# Patient Record
Sex: Female | Born: 1945
Health system: Southern US, Community
[De-identification: ages and names within clinical notes are randomized; demographics above are authoritative.]

## PROBLEM LIST (undated history)

## (undated) DIAGNOSIS — G20A1 Parkinson's disease without dyskinesia, without mention of fluctuations: Secondary | ICD-10-CM

---

## 2006-02-04 ENCOUNTER — Ambulatory Visit: Payer: Self-pay | Admitting: Internal Medicine

## 2008-05-03 ENCOUNTER — Ambulatory Visit: Payer: Self-pay | Admitting: Internal Medicine

## 2008-07-11 ENCOUNTER — Ambulatory Visit: Payer: Self-pay | Admitting: Internal Medicine

## 2008-10-23 ENCOUNTER — Ambulatory Visit: Payer: Self-pay | Admitting: Unknown Physician Specialty

## 2009-09-16 ENCOUNTER — Ambulatory Visit: Payer: Self-pay | Admitting: Internal Medicine

## 2009-12-19 ENCOUNTER — Ambulatory Visit: Payer: Self-pay | Admitting: Podiatrist

## 2010-04-23 ENCOUNTER — Ambulatory Visit: Payer: Self-pay | Admitting: Internal Medicine

## 2011-05-01 ENCOUNTER — Ambulatory Visit: Payer: Self-pay | Admitting: Internal Medicine

## 2011-07-20 ENCOUNTER — Ambulatory Visit: Payer: Self-pay | Admitting: Neurology

## 2011-11-26 ENCOUNTER — Ambulatory Visit: Payer: Self-pay | Admitting: Internal Medicine

## 2012-05-24 ENCOUNTER — Ambulatory Visit: Payer: Self-pay | Admitting: Internal Medicine

## 2012-05-26 ENCOUNTER — Ambulatory Visit: Payer: Self-pay | Admitting: Internal Medicine

## 2012-06-07 ENCOUNTER — Ambulatory Visit: Payer: Self-pay | Admitting: Neurology

## 2013-05-10 ENCOUNTER — Ambulatory Visit: Payer: Self-pay | Admitting: Internal Medicine

## 2013-06-19 ENCOUNTER — Ambulatory Visit: Payer: Self-pay | Admitting: Internal Medicine

## 2013-10-17 ENCOUNTER — Ambulatory Visit: Payer: Self-pay | Admitting: Ophthalmology

## 2013-11-08 ENCOUNTER — Ambulatory Visit: Payer: Self-pay | Admitting: Ophthalmology

## 2014-08-09 ENCOUNTER — Ambulatory Visit: Payer: Self-pay | Admitting: Internal Medicine

## 2014-12-11 ENCOUNTER — Ambulatory Visit: Payer: Self-pay | Admitting: Internal Medicine

## 2015-06-24 ENCOUNTER — Ambulatory Visit: Payer: PPO | Admitting: Physical Therapy

## 2015-06-24 ENCOUNTER — Ambulatory Visit: Payer: PPO | Attending: Internal Medicine | Admitting: Speech Pathology

## 2015-06-24 ENCOUNTER — Encounter: Payer: Self-pay | Admitting: Physical Therapy

## 2015-06-24 DIAGNOSIS — R29818 Other symptoms and signs involving the nervous system: Secondary | ICD-10-CM

## 2015-06-24 DIAGNOSIS — R490 Dysphonia: Secondary | ICD-10-CM | POA: Insufficient documentation

## 2015-06-24 DIAGNOSIS — R262 Difficulty in walking, not elsewhere classified: Secondary | ICD-10-CM | POA: Insufficient documentation

## 2015-06-24 NOTE — Therapy (Signed)
[place] [facility] MAIN [facility] SERVICES [address] [place], [place], [address] Phone: [phone]   Fax:  [phone]  Physical Therapy Evaluation  Patient Details  Name: Monica Watts MRN: [id number] Date of Birth: December 04, 1945 Referring Provider:  Danella Penton, MD  Encounter Date: 06/24/2015      PT End of Session - 06/24/15 1513    Visit Number 1   Number of Visits 17   Authorization Type 1   Authorization Time Period 10   PT Start Time 0150   PT Stop Time 0250   PT Time Calculation (min) 60 min   Equipment Utilized During Treatment Gait belt   Activity Tolerance Patient tolerated treatment well   Behavior During Therapy [facility] for tasks assessed/performed      No past medical history on file.  No past surgical history on file.  There were no vitals filed for this visit.  Visit Diagnosis:  Difficulty balancing  Difficulty walking      Subjective Assessment - 06/24/15 1457    Subjective Patient is having difficutly with walking and balance.    Currently in Pain? Yes   Pain Score 0-No pain            [facility] PT Assessment - 06/24/15 0001    Assessment   Medical Diagnosis parkinsons disease   Onset Date/Surgical Date 12/10/09   Hand Dominance Right   Next MD Visit 07/01/15   Prior Therapy no   Precautions   Precautions None   Balance Screen   Has the patient fallen in the past 6 months No   Has the patient had a decrease in activity level because of a fear of falling?  Yes   Is the patient reluctant to leave their home because of a fear of falling?  No   Home [employer] information [employer] residence   Living Arrangements Spouse/significant other   Available Help at Discharge Family   Type of Home House   Home Access Level entry        Eval; Outcome measures: TUG:9.13 sec 10 MW:1.22 m/sec 5 x sit stand:16.31 sec 6 MW; 1240 feet Peg test : right   42.98 sec   Left 40.99 sec Patient has difficulty with  buttoning, putting on make up, small backs on earings, picking up object form the floor, slide down a booth, feed my self, snapping a bean, putting on a sweater, socks and shoes, pants, writing, safety belt, transfers in and out of the car Gait training : Patient ambulates without AD with decreased arm swing bilaterally Coordination: impaired BUE hands Sensation: impaired BLE feet numbness Strength: 4/5 BLE, 5/5 BUE and grip is [facility] Balance: poor standing balance, unable to tandem stand or single leg stand                          PT Long Term Goals - 06/24/15 1510    PT LONG TERM GOAL #1   Title Patient will increase 10 meter walk test to >1.25m/s as to improve gait speed for better community ambulation and to reduce fall    PT LONG TERM GOAL #2   Title Patient will increase six minute walk test distance to >1000 for progression to community ambulator and improve gait ability   PT LONG TERM GOAL #3   Title Patient will tolerate 5 seconds of single leg stance without loss of balance to improve ability to get in and out  of shower safely   PT LONG TERM GOAL #4   Title Patient (< 61 years old) will complete five times sit to stand test in < 10 seconds indicating an increased LE strength and improved balance.               Plan - 06-27-2015 1514    Clinical Impression Statement Patient is 69 yr old female with Dx of parkinsons and impaired gait decreased dynamic standing balance, decreased hand coordination and increased falls risk.    Pt will benefit from skilled therapeutic intervention in order to improve on the following deficits Abnormal gait;Decreased endurance;Impaired sensation;Pain;Decreased activity tolerance;Decreased strength;Decreased balance;Decreased mobility;Difficulty walking;Decreased coordination;Decreased safety awareness   Rehab Potential Good   PT Frequency 4x / week   PT Duration 4 weeks   PT Treatment/Interventions Balance training;Therapeutic  exercise;Therapeutic activities;Functional mobility training;Stair training;Gait training;Neuromuscular re-education   Consulted and Agree with Plan of Care Patient          G-Codes - 06/27/15 1642    Functional Assessment Tool Used TUG, 5 x sit to stand, 10 MW, 6 MW   Functional Limitation Mobility: Walking and moving around   Mobility: Walking and Moving Around Current Status [phone]) At least 20 percent but less than 40 percent impaired, limited or restricted   Mobility: Walking and Moving Around Goal Status [phone]) At least 1 percent but less than 20 percent impaired, limited or restricted       Problem List There are no active problems to display for this patient.   Ezekiel Ina Jun 27, 2015, 4:59 PM   [facility] MAIN [facility] SERVICES [address] [place], [place], [address] Phone: [phone]   Fax:  [phone]

## 2015-06-25 ENCOUNTER — Encounter: Payer: Self-pay | Admitting: Speech Pathology

## 2015-06-25 NOTE — Therapy (Signed)
[place] [facility] MAIN [facility] SERVICES [address] [place], [place], [address] Phone: [phone]   Fax:  [phone]  Speech Language Pathology Evaluation  Patient Details  Name: Monica Watts MRN: [id number] Date of Birth: Apr 25, 1970 Referring Provider:  Danella Penton, MD  Encounter Date: 06/24/2015      End of Session - 06/25/15 1237    Visit Number 1   Number of Visits 17   Date for SLP Re-Evaluation 08/02/15   SLP Start Time 1400   SLP Stop Time  1450   SLP Time Calculation (min) 50 min   Activity Tolerance Patient tolerated treatment well      History reviewed. No pertinent past medical history.  History reviewed. No pertinent past surgical history.  There were no vitals filed for this visit.  Visit Diagnosis: Dysphonia - Plan: SLP plan of care cert/re-cert      Subjective Assessment - 06/25/15 1233    Subjective The patient reports that she has observed changes due to Parkinson's; changes include hypophonia, hoarse vocal quality, monotone speech, and decreased facial expressions.   Currently in Pain? No/denies            SLP Evaluation [facility] - 06/25/15 1233    SLP Visit Information   SLP Received On 06/24/15   Onset Date 05/09/2015   Medical Diagnosis Parkinson's disease   Subjective   Patient/Family Stated Goal Robust, audible voice   Prior Functional Status   Cognitive/Linguistic Baseline Within functional limits  Worsening vocal quality and loudness due to PD   Standardized Assessments   Standardized Assessments  Other Assessment  LSVT-LOUD Voice Evaluation        LSVT-LOUD Voice Evaluation  Voice history: The patient reports that she has observed changes due to Parkinsons; changes include hypophonia, hoarse vocal quality, monotone speech, and decreased facial expressions.  Maximum phonation time for sustained ah: 17 seconds  Mean intensity during sustained ah: 70 dB (Initially at 65 dB average, but she  spontaneously increased loudness secondary previous knowledge of LSVT-LOUD)  Mean intensity sustained during conversational speech: 63 dB  Average fundamental frequency during sustained ah: 184 Hz (2.2 STD below mean for age and gender)  Highest dynamic pitch when altering pitch from a low note to a high note: 402 Hz  Highest pitch during conversational speech: 385 Hz  Lowest dynamic pitch when altering from a high note to a low note: 178 Hz  Lowest pitch during conversational speech: 120 Hz  Visi-Pitch: Multi-Dimensional Voice Program (MDVP)  MDVP extracts objective quantitative values (Relative Average Perturbation, Shimmer, Voice Turbulence Index, and Noise to Harmonic Ratio) on sustained phonation, which are displayed graphically and numerically in comparison to a built-in normative database.  The patient exhibited values outside the norm for Relative Average Perturbation, Shimmer, Voice Turbulence Index, and Noise to Harmonic Ratio.  Average fundamental frequency was 2.2 STD below the average for age and gender. The patient improved all parameters when cued to alter voicing (loud like me).         SLP Education - 06/25/15 1236    Education provided Yes   Education Details Describe LSVT-LOUD protocol and expected outcome   Person(s) Educated Patient   Methods Explanation   Comprehension Verbalized understanding            SLP Long Term Goals - 06/25/15 1241    SLP LONG TERM GOAL #1   Title The patient will complete Daily Tasks (Maximum duration "ah", High/Lows, and Functional Phrases) at  average loudness of 80 dB and with loud, good quality voice.    Time 4   Period Weeks   Status New   SLP LONG TERM GOAL #2   Title The patient will complete Hierarchal Speech Loudness reading drills (words/phrases, sentences, and paragraph) at average 75 dB and with loud, good quality voice.     Time 4   Period Weeks   Status New   SLP LONG TERM GOAL #3   Title The patient will  complete homework daily.   Time 4   Period Weeks   Status New   SLP LONG TERM GOAL #4   Title The patient will participate in conversation, maintaining average loudness of 75 dB and loud, good quality voice.   Time 4   Period Weeks   Status New          Plan - 07/04/15 1238    Clinical Impression Statement This 69 year old woman with diagnosed Parkinson' disease is presenting with moderate-severe voice disorder characterized by hoarse vocal quality, hypophonia, monotone voice, and decreased facial expression.  Based on stimulability testing, the patient is judged to be a good candidate for the LSVT LOUD program.  It is recommended that the patient receive the LSVT LOUD program which is comprised of 16 intensive sessions (4 times per week for 4 weeks, one hour sessions).  Prognosis for improvement is good based on his motivation, stimulability, and strong family support.  LSVT LOUD has been documented in the literature as efficacious for individuals with Parkinson's disease.     Speech Therapy Frequency 4x / week   Duration 4 weeks   Treatment/Interventions Other (comment)  LSVT-LOUD Protocol   Potential to Achieve Goals Good   Potential Considerations Ability to learn/carryover information;Co-morbidities;Cooperation/participation level;Previous level of function;Severity of impairments;Family/community support;Other (comment)  Stimulability   SLP Home Exercise Plan LSVT-LOUD daily homework   Consulted and Agree with Plan of Care Patient          G-Codes - 04-Jul-2015 1242    Functional Assessment Tool Used LSVT-LOUD Evaluation Protocol   Functional Limitations Voice   Voice Current Status (G9171) At least 60 percent but less than 80 percent impaired, limited or restricted   Voice Goal Status (G9172) At least 1 percent but less than 20 percent impaired, limited or restricted      Problem List There are no active problems to display for this patient.  Dollene Primrose, MS/CCC-  SLP  Leandrew Koyanagi 07-04-2015, 12:47 PM  [place] [facility] MAIN [facility] SERVICES [address] [place], [place], [address] Phone: [phone]   Fax:  [phone]

## 2015-07-01 ENCOUNTER — Encounter: Payer: Self-pay | Admitting: Speech Pathology

## 2015-07-01 ENCOUNTER — Ambulatory Visit: Payer: PPO | Admitting: Speech Pathology

## 2015-07-01 ENCOUNTER — Encounter: Payer: Self-pay | Admitting: Physical Therapy

## 2015-07-01 ENCOUNTER — Ambulatory Visit: Payer: PPO | Admitting: Physical Therapy

## 2015-07-01 DIAGNOSIS — R262 Difficulty in walking, not elsewhere classified: Secondary | ICD-10-CM

## 2015-07-01 DIAGNOSIS — R490 Dysphonia: Secondary | ICD-10-CM | POA: Diagnosis not present

## 2015-07-01 DIAGNOSIS — R29818 Other symptoms and signs involving the nervous system: Secondary | ICD-10-CM

## 2015-07-01 NOTE — Therapy (Signed)
[place] [facility] MAIN [facility] SERVICES [address] [place], [place], [address] Phone: [phone]   Fax:  [phone]  Physical Therapy Treatment  Patient Details  Name: Monica Watts MRN: [id number] Date of Birth: January 12, 1946 Referring Provider:  Danella Penton, MD  Encounter Date: 07/01/2015      PT End of Session - 07/01/15 1340    Visit Number 2   Number of Visits 17   Authorization Type 2   Authorization Time Period 10   PT Start Time 0100   PT Stop Time 0155   PT Time Calculation (min) 55 min   Equipment Utilized During Treatment Gait belt   Activity Tolerance Patient tolerated treatment well   Behavior During Therapy [facility] for tasks assessed/performed      History reviewed. No pertinent past medical history.  History reviewed. No pertinent past surgical history.  There were no vitals filed for this visit.  Visit Diagnosis:  Difficulty balancing  Difficulty walking      Subjective Assessment - 07/01/15 1338    Subjective Patient is having difficulty with balance and walking.    Currently in Pain? No/denies       Floor to ceiling x 10 reps, side to side 10 reps, step and reach forward x 10 reps, step and reach backwards x 10, step and reach sideways x 10 , Rock and reach forward/backward x 10 , Rock and reach sideways x 10, functional tasks; 1 sit to stand 2. Coins bank, cards shuffle for improving motor control for buttons and manipulating small items, and opening watter bottles3. Activities to improve seat belt in the car. 4. Raising up legs to get in and out of the car. 5. Get up on the bed. Continues to have balance deficits typical with diagnosis. Patient performs beginning level exercises without pain behaviors and needs verbal cuing for postural alignment and head positioning                           PT Education - 07/01/15 1339    Education provided Yes   Person(s) Educated Patient   Methods  Explanation   Comprehension Verbalized understanding             PT Long Term Goals - 06/24/15 1510    PT LONG TERM GOAL #1   Title Patient will increase 10 meter walk test to >1.34m/s as to improve gait speed for better community ambulation and to reduce fall    PT LONG TERM GOAL #2   Title Patient will increase six minute walk test distance to >1000 for progression to community ambulator and improve gait ability   PT LONG TERM GOAL #3   Title Patient will tolerate 5 seconds of single leg stance without loss of balance to improve ability to get in and out of shower safely   PT LONG TERM GOAL #4   Title Patient (< 4 years old) will complete five times sit to stand test in < 10 seconds indicating an increased LE strength and improved balance.               Plan - 07/01/15 1344    Clinical Impression Statement Patient was instructed in LSVT BIG exercise program and had difficulty with sequencing and stepping for all exericses.    Pt will benefit from skilled therapeutic intervention in order to improve on the following deficits Abnormal gait;Decreased endurance;Impaired sensation;Pain;Decreased activity tolerance;Decreased strength;Decreased balance;Decreased mobility;Difficulty walking;Decreased coordination;Decreased  safety awareness   Rehab Potential Good   PT Frequency 4x / week   PT Duration 4 weeks   PT Treatment/Interventions Balance training;Therapeutic exercise;Therapeutic activities;Functional mobility training;Stair training;Gait training;Neuromuscular re-education   Consulted and Agree with Plan of Care Patient        Problem List There are no active problems to display for this patient.   Ezekiel Ina 07/01/2015, 1:46 PM  [place] [facility] MAIN [facility] SERVICES [address] [place], [place], [address] Phone: [phone]   Fax:  [phone]

## 2015-07-01 NOTE — Therapy (Signed)
[place] [facility] MAIN [facility] SERVICES [address] [place], [place], [address] Phone: [phone]   Fax:  [phone]  Speech Language Pathology Treatment  Patient Details  Name: Monica Watts MRN: [id number] Date of Birth: Jun 13, 1946 Referring Provider:  Danella Penton, MD  Encounter Date: 07/01/2015      End of Session - 07/01/15 1454    Visit Number 2   Number of Visits 17   Date for SLP Re-Evaluation 08/02/15   SLP Start Time 1405   SLP Stop Time  1455   SLP Time Calculation (min) 50 min   Activity Tolerance Patient tolerated treatment well      History reviewed. No pertinent past medical history.  History reviewed. No pertinent past surgical history.  There were no vitals filed for this visit.  Visit Diagnosis: Dysphonia      Subjective Assessment - 07/01/15 1453    Subjective "That seems too loud to me".   Currently in Pain? No/denies               ADULT SLP TREATMENT - 07/01/15 0001    General Information   Behavior/Cognition Alert;Cooperative;Pleasant mood   Treatment Provided   Treatment provided Cognitive-Linquistic   Pain Assessment   Pain Assessment No/denies pain   Cognitive-Linquistic Treatment   Treatment focused on Voice  LSVT-LOUD   Skilled Treatment Daily Task #1: Average 12 seconds, 85 dB. Requires min-mod cues for loudness and quality.  Daily Task 2: Highs: 15 high pitched "ah" given mod-max cues. Lows: 15 low pitched "ah" given mod-max cues. Daily task #3: Average 70 dB.  Hierarchal speech loudness drill: Read words, 68 dB.  Read phrases, 70 dB. Homework: assignments given.  Off the cuff remarks: average 63 dB.   Assessment / Recommendations / Plan   Plan Continue with current plan of care   Progression Toward Goals   Progression toward goals Progressing toward goals          SLP Education - 07/01/15 1453    Education provided Yes   Education Details LSVT-LOUD protocol   Person(s) Educated  Patient   Methods Explanation;Demonstration;Verbal cues;Handout   Comprehension Verbalized understanding;Returned demonstration;Need further instruction            SLP Long Term Goals - 06/25/15 1241    SLP LONG TERM GOAL #1   Title The patient will complete Daily Tasks (Maximum duration "ah", High/Lows, and Functional Phrases) at average loudness of 80 dB and with loud, good quality voice.    Time 4   Period Weeks   Status New   SLP LONG TERM GOAL #2   Title The patient will complete Hierarchal Speech Loudness reading drills (words/phrases, sentences, and paragraph) at average 75 dB and with loud, good quality voice.     Time 4   Period Weeks   Status New   SLP LONG TERM GOAL #3   Title The patient will complete homework daily.   Time 4   Period Weeks   Status New   SLP LONG TERM GOAL #4   Title The patient will participate in conversation, maintaining average loudness of 75 dB and loud, good quality voice.   Time 4   Period Weeks   Status New          Plan - 07/01/15 1455    Clinical Impression Statement The patient is completing daily tasks and hierarchal speech drill tasks with loud, good quality voice given mod-max SLP cues for loudness and quality.  Speech Therapy Frequency 4x / week   Duration 4 weeks   Treatment/Interventions Other (comment)  LSVT-LOUD   Potential to Achieve Goals Good   Potential Considerations Ability to learn/carryover information;Co-morbidities;Cooperation/participation level;Previous level of function;Severity of impairments;Family/community support;Other (comment)   SLP Home Exercise Plan LSVT-LOUD daily homework   Consulted and Agree with Plan of Care Patient        Problem List There are no active problems to display for this patient.  Dollene Primrose, MS/CCC- SLP  Leandrew Koyanagi 07/01/2015, 2:56 PM  [place] [facility] MAIN [facility] SERVICES [address] [place], [place], [address] Phone:  [phone]   Fax:  [phone]

## 2015-07-02 ENCOUNTER — Encounter: Payer: Self-pay | Admitting: Physical Therapy

## 2015-07-02 ENCOUNTER — Ambulatory Visit: Payer: PPO | Admitting: Speech Pathology

## 2015-07-02 ENCOUNTER — Encounter: Payer: Self-pay | Admitting: Speech Pathology

## 2015-07-02 ENCOUNTER — Ambulatory Visit: Payer: PPO | Admitting: Physical Therapy

## 2015-07-02 DIAGNOSIS — R262 Difficulty in walking, not elsewhere classified: Secondary | ICD-10-CM

## 2015-07-02 DIAGNOSIS — R29818 Other symptoms and signs involving the nervous system: Secondary | ICD-10-CM

## 2015-07-02 DIAGNOSIS — R490 Dysphonia: Secondary | ICD-10-CM

## 2015-07-02 NOTE — Therapy (Signed)
[place] [facility] MAIN [facility] SERVICES [address] [place], [place], [address] Phone: [phone]   Fax:  [phone]  Speech Language Pathology Treatment  Patient Details  Name: Monica Watts MRN: [id number] Date of Birth: [birth year] Referring Provider:  Danella Penton, MD  Encounter Date: 07/02/2015      End of Session - 07/02/15 1640    Visit Number 3   Number of Visits 17   Date for SLP Re-Evaluation 08/02/15   SLP Start Time 1400   SLP Stop Time  1456   SLP Time Calculation (min) 56 min   Activity Tolerance Patient tolerated treatment well      History reviewed. No pertinent past medical history.  History reviewed. No pertinent past surgical history.  There were no vitals filed for this visit.  Visit Diagnosis: Dysphonia      Subjective Assessment - 07/02/15 1639    Subjective "I was loud with Dr. Hyacinth Meeker yesterday and he noticed!"   Currently in Pain? No/denies               ADULT SLP TREATMENT - 07/02/15 0001    General Information   Behavior/Cognition Alert;Cooperative;Pleasant mood   Treatment Provided   Treatment provided Cognitive-Linquistic   Pain Assessment   Pain Assessment No/denies pain   Cognitive-Linquistic Treatment   Treatment focused on Voice  LSVT-LOUD   Skilled Treatment Daily Task #1: Average 14 seconds, 85 dB. Requires min cues for loudness and quality.  Daily Task 2: Highs: 15 high pitched "ah" given mod cues. Lows: 15 low pitched "ah" given mod cues. Daily task #3: Average 73 dB.  Hierarchal speech loudness drill: Read words, 70 dB.  Read phrases/sentences, 73 dB. Homework: assignments completed.  Off the cuff remarks: average 66 dB, up to 70 dB given min SLP reminder.   Assessment / Recommendations / Plan   Plan Continue with current plan of care   Progression Toward Goals   Progression toward goals Progressing toward goals          SLP Education - 07/02/15 1640    Education provided Yes    Education Details LSVT-LOUD   Person(s) Educated Patient   Methods Explanation;Demonstration;Verbal cues;Handout   Comprehension Verbalized understanding;Returned demonstration;Need further instruction            SLP Long Term Goals - 06/25/15 1241    SLP LONG TERM GOAL #1   Title The patient will complete Daily Tasks (Maximum duration "ah", High/Lows, and Functional Phrases) at average loudness of 80 dB and with loud, good quality voice.    Time 4   Period Weeks   Status New   SLP LONG TERM GOAL #2   Title The patient will complete Hierarchal Speech Loudness reading drills (words/phrases, sentences, and paragraph) at average 75 dB and with loud, good quality voice.     Time 4   Period Weeks   Status New   SLP LONG TERM GOAL #3   Title The patient will complete homework daily.   Time 4   Period Weeks   Status New   SLP LONG TERM GOAL #4   Title The patient will participate in conversation, maintaining average loudness of 75 dB and loud, good quality voice.   Time 4   Period Weeks   Status New          Plan - 07/02/15 1641    Clinical Impression Statement The patient is completing daily tasks and hierarchal speech drill tasks with loud,  good quality voice given fewer SLP cues for loudness and quality.  She is demonstrating inconsistent generalization into conversational speech.   Speech Therapy Frequency 4x / week   Duration 4 weeks   Treatment/Interventions Other (comment)  LSVT-LOUD   Potential to Achieve Goals Good   Potential Considerations Ability to learn/carryover information;Co-morbidities;Cooperation/participation level;Previous level of function;Severity of impairments;Family/community support;Other (comment)   SLP Home Exercise Plan LSVT-LOUD daily homework   Consulted and Agree with Plan of Care Patient        Problem List There are no active problems to display for this patient.  Dollene Primrose, MS/CCC- SLP  Leandrew Koyanagi 07/02/2015, 4:42  PM  [place] [facility] MAIN [facility] SERVICES [address] [place], [place], [address] Phone: [phone]   Fax:  [phone]

## 2015-07-02 NOTE — Therapy (Signed)
[place] [facility] MAIN [facility] SERVICES [address] [place], [place], [address] Phone: [phone]   Fax:  [phone]  Physical Therapy Treatment  Patient Details  Name: Monica Watts MRN: [id number] Date of Birth: 13-Nov-1945 Referring Provider:  Danella Penton, MD  Encounter Date: 07/02/2015      PT End of Session - 07/02/15 1508    Visit Number 3   Number of Visits 17   Authorization Type 2   Authorization Time Period 10   PT Start Time 0300   PT Stop Time 0355   PT Time Calculation (min) 55 min   Equipment Utilized During Treatment Gait belt   Activity Tolerance Patient tolerated treatment well   Behavior During Therapy [facility] for tasks assessed/performed      History reviewed. No pertinent past medical history.  History reviewed. No pertinent past surgical history.  There were no vitals filed for this visit.  Visit Diagnosis:  Difficulty walking  Difficulty balancing      Subjective Assessment - 07/02/15 1505    Subjective Patient did her exercises but feels like she has questions and is not sure she did them correctly.    Currently in Pain? No/denies        Floor to ceiling x 10 reps, side to side 10 reps, step and reach forward x 10 reps, step and reach backwards x 10, step and reach sideways x 10 , Rock and reach forward/backward x 10 , Rock and reach sideways x 10, functional tasks; 1 sit to stand 2. Coins bank, cards shuffle for improving motor control for buttons and manipulating small items, and opening watter bottles3. Activities to improve seat belt in the car. 4. Raising up legs to get in and out of the car. 5. Get up on the bed. Continues to have balance deficits typical with diagnosis. Patient performs beginning level exercises without pain behaviors and needs verbal cuing for postural alignment and head positioning                          PT Education - 07/02/15 1507    Education provided Yes   Education Details LSVT BIG   Person(s) Educated Patient   Methods Explanation   Comprehension Verbalized understanding             PT Long Term Goals - 06/24/15 1510    PT LONG TERM GOAL #1   Title Patient will increase 10 meter walk test to >1.32m/s as to improve gait speed for better community ambulation and to reduce fall    PT LONG TERM GOAL #2   Title Patient will increase six minute walk test distance to >1000 for progression to community ambulator and improve gait ability   PT LONG TERM GOAL #3   Title Patient will tolerate 5 seconds of single leg stance without loss of balance to improve ability to get in and out of shower safely   PT LONG TERM GOAL #4   Title Patient (< 54 years old) will complete five times sit to stand test in < 10 seconds indicating an increased LE strength and improved balance.               Plan - 07/02/15 1508    Clinical Impression Statement  pt especially has difficulty with opening hands up towards the ceiling with multiple exercises and with extending his leg during seated side to side multi-directional reaching activity   Pt will  benefit from skilled therapeutic intervention in order to improve on the following deficits Abnormal gait;Decreased endurance;Impaired sensation;Pain;Decreased activity tolerance;Decreased strength;Decreased balance;Decreased mobility;Difficulty walking;Decreased coordination;Decreased safety awareness   PT Frequency 4x / week   PT Duration 4 weeks   PT Treatment/Interventions Balance training;Therapeutic exercise;Therapeutic activities;Functional mobility training;Stair training;Gait training;Neuromuscular re-education        Problem List There are no active problems to display for this patient.   Ezekiel Ina 07/02/2015, 3:10 PM  [place] [facility] MAIN [facility] SERVICES [address] [place], [place], [address] Phone: [phone]   Fax:  [phone]

## 2015-07-03 ENCOUNTER — Encounter: Payer: Self-pay | Admitting: Physical Therapy

## 2015-07-03 ENCOUNTER — Ambulatory Visit: Payer: PPO | Admitting: Physical Therapy

## 2015-07-03 ENCOUNTER — Ambulatory Visit: Payer: PPO | Admitting: Speech Pathology

## 2015-07-03 ENCOUNTER — Encounter: Payer: Self-pay | Admitting: Speech Pathology

## 2015-07-03 DIAGNOSIS — R262 Difficulty in walking, not elsewhere classified: Secondary | ICD-10-CM

## 2015-07-03 DIAGNOSIS — R490 Dysphonia: Secondary | ICD-10-CM | POA: Diagnosis not present

## 2015-07-03 DIAGNOSIS — R29818 Other symptoms and signs involving the nervous system: Secondary | ICD-10-CM

## 2015-07-03 NOTE — Therapy (Signed)
[place] [facility] MAIN [facility] SERVICES [address] [place], [place], [address] Phone: [phone]   Fax:  [phone]  Speech Language Pathology Treatment  Patient Details  Name: Monica Watts MRN: [id number] Date of Birth: Aug 01, 1946 Referring Provider:  Danella Penton, MD  Encounter Date: 07/03/2015      End of Session - 07/03/15 1459    Visit Number 4   Number of Visits 17   Date for SLP Re-Evaluation 08/02/15   SLP Start Time 1400   SLP Stop Time  1456   SLP Time Calculation (min) 56 min   Activity Tolerance Patient tolerated treatment well      History reviewed. No pertinent past medical history.  History reviewed. No pertinent past surgical history.  There were no vitals filed for this visit.  Visit Diagnosis: Dysphonia      Subjective Assessment - 07/03/15 1458    Subjective Patient reports that she is tired today.   Currently in Pain? No/denies               ADULT SLP TREATMENT - 07/03/15 0001    General Information   Behavior/Cognition Alert;Cooperative;Pleasant mood   Treatment Provided   Treatment provided Cognitive-Linquistic   Pain Assessment   Pain Assessment No/denies pain   Cognitive-Linquistic Treatment   Treatment focused on Voice  LSVT-LOUD   Skilled Treatment Daily Task #1: Average 14 seconds, 85 dB. Requires min-mod cues for loudness and quality.  Daily Task 2: Highs: 15 high pitched "ah" given mod cues. Lows: 15 low pitched "ah" given mod cues. Daily task #3: Average 73 dB.  Hierarchal speech loudness drill: Read phrases/sentences, 73 dB. Generate short phrases to answer questions, 70 dB.  Homework: assignments completed.  Off the cuff remarks: average 66 dB, up to 70 dB given min-mod SLP reminder.   Assessment / Recommendations / Plan   Plan Continue with current plan of care   Progression Toward Goals   Progression toward goals Progressing toward goals          SLP Education - 07/03/15 1459     Education provided Yes   Education Details LSVT-LOUD   Person(s) Educated Patient   Methods Explanation;Demonstration;Verbal cues;Handout   Comprehension Verbalized understanding;Returned demonstration;Need further instruction            SLP Long Term Goals - 06/25/15 1241    SLP LONG TERM GOAL #1   Title The patient will complete Daily Tasks (Maximum duration "ah", High/Lows, and Functional Phrases) at average loudness of 80 dB and with loud, good quality voice.    Time 4   Period Weeks   Status New   SLP LONG TERM GOAL #2   Title The patient will complete Hierarchal Speech Loudness reading drills (words/phrases, sentences, and paragraph) at average 75 dB and with loud, good quality voice.     Time 4   Period Weeks   Status New   SLP LONG TERM GOAL #3   Title The patient will complete homework daily.   Time 4   Period Weeks   Status New   SLP LONG TERM GOAL #4   Title The patient will participate in conversation, maintaining average loudness of 75 dB and loud, good quality voice.   Time 4   Period Weeks   Status New          Plan - 07/03/15 1459    Clinical Impression Statement The patient is completing daily tasks and hierarchal speech drill tasks with  loud, good quality voice given fewer SLP cues for loudness and quality.  She is demonstrating inconsistent generalization into conversational speech.   Speech Therapy Frequency 4x / week   Duration 4 weeks   Treatment/Interventions Other (comment)  LSVT-LOUD   Potential to Achieve Goals Good   Potential Considerations Ability to learn/carryover information;Co-morbidities;Cooperation/participation level;Previous level of function;Severity of impairments;Family/community support;Other (comment)   SLP Home Exercise Plan LSVT-LOUD daily homework   Consulted and Agree with Plan of Care Patient        Problem List There are no active problems to display for this patient.  Dollene Primrose, MS/CCC-  SLP  Leandrew Koyanagi 07/03/2015, 3:01 PM  [place] [facility] MAIN [facility] SERVICES [address] [place], [place], [address] Phone: [phone]   Fax:  [phone]

## 2015-07-03 NOTE — Therapy (Signed)
[place] [facility] MAIN [facility] SERVICES [address] [place], [place], [address] Phone: [phone]   Fax:  [phone]  Physical Therapy Treatment  Patient Details  Name: Monica Watts MRN: [id number] Date of Birth: 1946/09/30 Referring Provider:  Danella Penton, MD  Encounter Date: 07/03/2015      PT End of Session - 07/03/15 1508    Visit Number 4   Number of Visits 17   Authorization Type 4   PT Start Time 0300   PT Stop Time 0355   PT Time Calculation (min) 55 min   Equipment Utilized During Treatment Gait belt   Activity Tolerance Patient tolerated treatment well   Behavior During Therapy [facility] for tasks assessed/performed      History reviewed. No pertinent past medical history.  History reviewed. No pertinent past surgical history.  There were no vitals filed for this visit.  Visit Diagnosis:  Difficulty walking  Difficulty balancing      Subjective Assessment - 07/03/15 1507    Subjective Patient is performing her exercises at home and has several questions.       Floor to ceiling x 10 reps, side to side 10 reps, step and reach forward x 10 reps, step and reach backwards x 10, step and reach sideways x 10 , Rock and reach forward/backward x 10 , Rock and reach sideways x 10, functional tasks; 1 sit to stand 2. Coins bank, cards shuffle for improving motor control for buttons and manipulating small items, and opening watter bottles3. Activities to improve seat belt in the car. 4. Raising up legs to get in and out of the car. 5. Get up on the bed. Continues to have balance deficits typical with diagnosis. Patient performs beginning level exercises without pain behaviors and needs verbal cuing for postural alignment and head positioning  Decreased coordination demonstrated requiring consistent verbal cueing to correct form                           PT Education - 07/03/15 1508    Education Details LSVT BIG    Person(s) Educated Patient   Methods Explanation   Comprehension Verbalized understanding             PT Long Term Goals - 06/24/15 1510    PT LONG TERM GOAL #1   Title Patient will increase 10 meter walk test to >1.82m/s as to improve gait speed for better community ambulation and to reduce fall    PT LONG TERM GOAL #2   Title Patient will increase six minute walk test distance to >1000 for progression to community ambulator and improve gait ability   PT LONG TERM GOAL #3   Title Patient will tolerate 5 seconds of single leg stance without loss of balance to improve ability to get in and out of shower safely   PT LONG TERM GOAL #4   Title Patient (< 73 years old) will complete five times sit to stand test in < 10 seconds indicating an increased LE strength and improved balance.               Plan - 07/03/15 1509    Clinical Impression Statement Miod cueing needed to appropriately perform LSVT tasks with leg, hand, and head position. Decreased coordination demonstrated requiring consistent verbal cueing to correct form. Cognitive understanding of task was delayed   Pt will benefit from skilled therapeutic intervention in order to improve on the  following deficits Abnormal gait;Decreased endurance;Impaired sensation;Pain;Decreased activity tolerance;Decreased strength;Decreased balance;Decreased mobility;Difficulty walking;Decreased coordination;Decreased safety awareness   PT Frequency 4x / week   PT Duration 4 weeks   PT Treatment/Interventions Balance training;Therapeutic exercise;Therapeutic activities;Functional mobility training;Stair training;Gait training;Neuromuscular re-education   Consulted and Agree with Plan of Care Patient        Problem List There are no active problems to display for this patient.   Jones Broom S 07/03/2015, 3:13 PM  [place] [facility] MAIN [facility] SERVICES [address] [place], [place],  [address] Phone: [phone]   Fax:  [phone]

## 2015-07-04 ENCOUNTER — Encounter: Payer: Self-pay | Admitting: Speech Pathology

## 2015-07-04 ENCOUNTER — Ambulatory Visit: Payer: PPO | Admitting: Physical Therapy

## 2015-07-04 ENCOUNTER — Ambulatory Visit: Payer: PPO | Admitting: Speech Pathology

## 2015-07-04 ENCOUNTER — Encounter: Payer: Self-pay | Admitting: Physical Therapy

## 2015-07-04 DIAGNOSIS — R262 Difficulty in walking, not elsewhere classified: Secondary | ICD-10-CM

## 2015-07-04 DIAGNOSIS — R490 Dysphonia: Secondary | ICD-10-CM | POA: Diagnosis not present

## 2015-07-04 DIAGNOSIS — R29818 Other symptoms and signs involving the nervous system: Secondary | ICD-10-CM

## 2015-07-04 NOTE — Therapy (Signed)
[place] [facility] MAIN [facility] SERVICES [address] [place], [place], [address] Phone: [phone]   Fax:  [phone]  Physical Therapy Treatment  Patient Details  Name: Monica Watts MRN: [id number] Date of Birth: June 09, 1946 Referring Provider:  Danella Penton, MD  Encounter Date: 07/04/2015      PT End of Session - 07/04/15 1509    Visit Number 5   Number of Visits 17   Authorization Type 5   Authorization Time Period 10   PT Start Time 0300   PT Stop Time 0400   PT Time Calculation (min) 60 min   Equipment Utilized During Treatment Gait belt   Activity Tolerance Patient tolerated treatment well   Behavior During Therapy [facility] for tasks assessed/performed      History reviewed. No pertinent past medical history.  History reviewed. No pertinent past surgical history.  There were no vitals filed for this visit.  Visit Diagnosis:  Difficulty walking  Difficulty balancing      Subjective Assessment - 07/04/15 1505    Subjective Patient is doing her HEP and is feeling better about her her HEP but is sore from her fall 2 days ago.              Floor to ceiling x 10 reps, side to side 10 reps, step and reach forward x 10 reps, step and reach backwards x 10, step and reach sideways x 10 , Rock and reach forward/backward x 10 , Rock and reach sideways x 10, functional tasks; 1 sit to stand 2. Coins bank, cards shuffle for improving motor control for buttons and manipulating small items, and opening watter bottles3. Activities to improve seat belt in the car. 4. Raising up legs to get in and out of the car. 5. Get up on the bed. ct  Patient continues to demonstrate some in coordination of movement with select exercises such as rock and reach and stepping backwards. He responds well to verbal and tactile cues to correct form and technique                                 PT Education - 07/04/15 1508    Education  provided Yes   Education Details LSVT BIG   Person(s) Educated Patient   Methods Explanation   Comprehension Verbalized understanding             PT Long Term Goals - 06/24/15 1510    PT LONG TERM GOAL #1   Title Patient will increase 10 meter walk test to >1.61m/s as to improve gait speed for better community ambulation and to reduce fall    PT LONG TERM GOAL #2   Title Patient will increase six minute walk test distance to >1000 for progression to community ambulator and improve gait ability   PT LONG TERM GOAL #3   Title Patient will tolerate 5 seconds of single leg stance without loss of balance to improve ability to get in and out of shower safely   PT LONG TERM GOAL #4   Title Patient (< 88 years old) will complete five times sit to stand test in < 10 seconds indicating an increased LE strength and improved balance.               Plan - 07/04/15 1510    Clinical Impression Statement  Pt especially has difficulty with opening his hands up towards the  ceiling with multiple exercises and with extending his leg during seated side to side multi-directional reaching activity   Pt will benefit from skilled therapeutic intervention in order to improve on the following deficits Abnormal gait;Decreased endurance;Impaired sensation;Pain;Decreased activity tolerance;Decreased strength;Decreased balance;Decreased mobility;Difficulty walking;Decreased coordination;Decreased safety awareness   PT Frequency 4x / week   PT Duration 4 weeks   PT Treatment/Interventions Balance training;Therapeutic exercise;Therapeutic activities;Functional mobility training;Stair training;Gait training;Neuromuscular re-education        Problem List There are no active problems to display for this patient.   Jones Broom S 07/04/2015, 3:13 PM  [place] [facility] MAIN [facility] SERVICES [address] [place], [place], [address] Phone: [phone]   Fax:   [phone]

## 2015-07-04 NOTE — Therapy (Signed)
[place] [facility] MAIN [facility] SERVICES [address] [place], [place], [address] Phone: [phone]   Fax:  [phone]  Speech Language Pathology Treatment  Patient Details  Name: Monica Watts MRN: [id number] Date of Birth: 1945/11/14 Referring Provider:  Danella Penton, MD  Encounter Date: 07/04/2015      End of Session - 07/04/15 1458    Visit Number 5   Number of Visits 17   Date for SLP Re-Evaluation 08/02/15   SLP Start Time 1400   SLP Stop Time  1455   SLP Time Calculation (min) 55 min   Activity Tolerance Patient tolerated treatment well      History reviewed. No pertinent past medical history.  History reviewed. No pertinent past surgical history.  There were no vitals filed for this visit.  Visit Diagnosis: Dysphonia      Subjective Assessment - 07/04/15 1457    Subjective Patient reports that she was on the go all day yesterday.   Currently in Pain? No/denies               ADULT SLP TREATMENT - 07/04/15 0001    General Information   Behavior/Cognition Alert;Cooperative;Pleasant mood   Treatment Provided   Treatment provided Cognitive-Linquistic   Pain Assessment   Pain Assessment No/denies pain   Cognitive-Linquistic Treatment   Treatment focused on Voice  LSVT-LOUD   Skilled Treatment Daily Task #1: Average 14 seconds, 85 dB. Requires min-mod cues for loudness and quality.  Daily Task 2: Highs: 15 high pitched "ah" given min cues. Lows: 15 low pitched "ah" given min cues. Daily task #3: Average 73 dB.  Hierarchal speech loudness drill: Read phrases/sentences, 73 dB. Generate short phrases to answer questions, 70 dB.  Homework: assignments completed.  Off the cuff remarks: average 66 dB, up to 70 dB given min-mod SLP reminder.   Assessment / Recommendations / Plan   Plan Continue with current plan of care   Progression Toward Goals   Progression toward goals Progressing toward goals          SLP Education -  07/04/15 1458    Education provided Yes   Education Details LSVT-LOUD   Person(s) Educated Patient   Methods Explanation;Demonstration;Verbal cues;Handout   Comprehension Verbalized understanding;Returned demonstration;Need further instruction            SLP Long Term Goals - 06/25/15 1241    SLP LONG TERM GOAL #1   Title The patient will complete Daily Tasks (Maximum duration "ah", High/Lows, and Functional Phrases) at average loudness of 80 dB and with loud, good quality voice.    Time 4   Period Weeks   Status New   SLP LONG TERM GOAL #2   Title The patient will complete Hierarchal Speech Loudness reading drills (words/phrases, sentences, and paragraph) at average 75 dB and with loud, good quality voice.     Time 4   Period Weeks   Status New   SLP LONG TERM GOAL #3   Title The patient will complete homework daily.   Time 4   Period Weeks   Status New   SLP LONG TERM GOAL #4   Title The patient will participate in conversation, maintaining average loudness of 75 dB and loud, good quality voice.   Time 4   Period Weeks   Status New          Plan - 07/04/15 1459    Clinical Impression Statement The patient is completing daily tasks and hierarchal  speech drill tasks with loud, good quality voice given fewer SLP cues for loudness and quality.  She is demonstrating increased facial expression and emerging generalization of loudness into conversational speech.   Speech Therapy Frequency 4x / week   Duration 4 weeks   Treatment/Interventions Other (comment)  LSVT-LOUD   Potential to Achieve Goals Good   Potential Considerations Ability to learn/carryover information;Co-morbidities;Cooperation/participation level;Previous level of function;Severity of impairments;Family/community support;Other (comment)   SLP Home Exercise Plan LSVT-LOUD daily homework   Consulted and Agree with Plan of Care Patient        Problem List There are no active problems to display for this  patient.  Dollene Primrose, MS/CCC- SLP  Leandrew Koyanagi 07/04/2015, 3:00 PM  [facility] MAIN [facility] SERVICES [address] [place], [place], [address] Phone: [phone]   Fax:  [phone]

## 2015-07-08 ENCOUNTER — Encounter: Payer: Self-pay | Admitting: Physical Therapy

## 2015-07-08 ENCOUNTER — Encounter: Payer: Self-pay | Admitting: Speech Pathology

## 2015-07-08 ENCOUNTER — Ambulatory Visit: Payer: PPO | Admitting: Speech Pathology

## 2015-07-08 ENCOUNTER — Ambulatory Visit: Payer: PPO | Admitting: Physical Therapy

## 2015-07-08 DIAGNOSIS — R29818 Other symptoms and signs involving the nervous system: Secondary | ICD-10-CM

## 2015-07-08 DIAGNOSIS — R490 Dysphonia: Secondary | ICD-10-CM

## 2015-07-08 DIAGNOSIS — R262 Difficulty in walking, not elsewhere classified: Secondary | ICD-10-CM

## 2015-07-08 NOTE — Therapy (Signed)
[place] [facility] MAIN [facility] SERVICES [address] [place], [place], [address] Phone: [phone]   Fax:  [phone]  Physical Therapy Treatment  Patient Details  Name: Monica Watts MRN: [id number] Date of Birth: [birth year] Referring Provider:  Danella Penton, MD  Encounter Date: 07/08/2015      PT End of Session - 07/08/15 1517    Visit Number 6   Number of Visits 17   Authorization Type 5   Authorization Time Period 10   PT Start Time 0300   PT Stop Time 0400   PT Time Calculation (min) 60 min   Equipment Utilized During Treatment Gait belt   Activity Tolerance Patient tolerated treatment well   Behavior During Therapy [facility] for tasks assessed/performed      History reviewed. No pertinent past medical history.  History reviewed. No pertinent past surgical history.  There were no vitals filed for this visit.  Visit Diagnosis:  Difficulty walking  Difficulty balancing      Subjective Assessment - 07/08/15 1516    Subjective Patient is doing her HEP and is feeling better about her her HEP and feels tired today.   Currently in Pain? No/denies         Floor to ceiling x 10 reps, side to side 10 reps, step and reach forward x 10 reps, step and reach backwards x 10, step and reach sideways x 10 , Rock and reach forward/backward x 10 , Rock and reach sideways x 10, functional tasks; 1 sit to stand 2. Coins bank, cards shuffle for improving motor control for buttons and manipulating small items, and opening watter bottles3. Activities to improve seat belt in the car. 4. Raising up legs to get in and out of the car. 5. Get up on the bed. ct  Patient continues to demonstrate some in coordination of movement with select exercises such as rock and reach and stepping backwards. He responds well to verbal and tactile cues to correct form and technique Continues to have balance deficits typical with diagnosis. Patient performs intermediate  level exercises without pain behaviors and needs verbal cuing for postural alignment and head positioning                         PT Education - 07/08/15 1516    Education provided Yes   Education Details LSVT BIG   Person(s) Educated Patient   Methods Explanation   Comprehension Verbalized understanding             PT Long Term Goals - 06/24/15 1510    PT LONG TERM GOAL #1   Title Patient will increase 10 meter walk test to >1.50m/s as to improve gait speed for better community ambulation and to reduce fall    PT LONG TERM GOAL #2   Title Patient will increase six minute walk test distance to >1000 for progression to community ambulator and improve gait ability   PT LONG TERM GOAL #3   Title Patient will tolerate 5 seconds of single leg stance without loss of balance to improve ability to get in and out of shower safely   PT LONG TERM GOAL #4   Title Patient (< 54 years old) will complete five times sit to stand test in < 10 seconds indicating an increased LE strength and improved balance.               Plan - 07/08/15 1518    Clinical  Impression Statement Max cueing needed to appropriately perform LSVT tasks with leg, hand, and head position   Pt will benefit from skilled therapeutic intervention in order to improve on the following deficits Abnormal gait;Decreased endurance;Impaired sensation;Pain;Decreased activity tolerance;Decreased strength;Decreased balance;Decreased mobility;Difficulty walking;Decreased coordination;Decreased safety awareness   PT Frequency 4x / week   PT Duration 4 weeks   PT Treatment/Interventions Balance training;Therapeutic exercise;Therapeutic activities;Functional mobility training;Stair training;Gait training;Neuromuscular re-education   Consulted and Agree with Plan of Care Patient        Problem List There are no active problems to display for this patient.   Jones Broom S 07/08/2015, 3:26 PM  [facility] MAIN [facility] SERVICES [address] [place], [place], [address] Phone: [phone]   Fax:  [phone]

## 2015-07-08 NOTE — Therapy (Signed)
[facility] MAIN [facility] SERVICES [address] [place], [place], [address] Phone: [phone]   Fax:  [phone]  Speech Language Pathology Treatment  Patient Details  Name: Monica Watts MRN: [id number] Date of Birth: 03-25-1946 Referring Provider:  Danella Penton, MD  Encounter Date: 07/08/2015      End of Session - 07/08/15 1530    Visit Number 6   Number of Visits 17   Date for SLP Re-Evaluation 08/02/15   SLP Start Time 1404   SLP Stop Time  1457   SLP Time Calculation (min) 53 min   Activity Tolerance Patient tolerated treatment well      History reviewed. No pertinent past medical history.  History reviewed. No pertinent past surgical history.  There were no vitals filed for this visit.  Visit Diagnosis: Dysphonia      Subjective Assessment - 07/08/15 1529    Subjective Patient was a little discouraged this PM.   Currently in Pain? No/denies               ADULT SLP TREATMENT - 07/08/15 0001    General Information   Behavior/Cognition Alert;Cooperative;Pleasant mood   Treatment Provided   Treatment provided Cognitive-Linquistic   Pain Assessment   Pain Assessment No/denies pain   Cognitive-Linquistic Treatment   Treatment focused on Voice  LSVT-LOUD   Skilled Treatment Daily Task #1: Average 10 seconds, 8 dB. Requires mod cues for loudness and quality.  Daily Task 2: Highs: 15 high pitched "ah" given min-mod cues. Lows: 15 low pitched "ah" given min-mod cues. Daily task #3: Average 73 dB.  Hierarchal speech loudness drill: Read phrases/sentences, 73 dB. Generate short phrases to answer questions, 70 dB.  Homework: assignments completed.  Off the cuff remarks: average 66 dB, up to 70 dB given mod SLP reminder.   Assessment / Recommendations / Plan   Plan Continue with current plan of care   Progression Toward Goals   Progression toward goals Progressing toward goals          SLP Education - 07/08/15 1530     Education provided Yes   Education Details LSVT-LOUD   Person(s) Educated Patient   Methods Explanation;Demonstration;Handout;Verbal cues   Comprehension Verbalized understanding;Returned demonstration;Verbal cues required;Need further instruction            SLP Long Term Goals - 06/25/15 1241    SLP LONG TERM GOAL #1   Title The patient will complete Daily Tasks (Maximum duration "ah", High/Lows, and Functional Phrases) at average loudness of 80 dB and with loud, good quality voice.    Time 4   Period Weeks   Status New   SLP LONG TERM GOAL #2   Title The patient will complete Hierarchal Speech Loudness reading drills (words/phrases, sentences, and paragraph) at average 75 dB and with loud, good quality voice.     Time 4   Period Weeks   Status New   SLP LONG TERM GOAL #3   Title The patient will complete homework daily.   Time 4   Period Weeks   Status New   SLP LONG TERM GOAL #4   Title The patient will participate in conversation, maintaining average loudness of 75 dB and loud, good quality voice.   Time 4   Period Weeks   Status New          Plan - 07/08/15 1531    Clinical Impression Statement Despite initial discouragement, the patient is completing daily tasks and  hierarchal speech drill tasks with loud, good quality voice given fewer SLP cues for loudness and quality.  She is demonstrating increased facial expression and emerging generalization of loudness into conversational speech.   Speech Therapy Frequency 4x / week   Duration 4 weeks   Treatment/Interventions Other (comment)  LSVT-LOUD   Potential to Achieve Goals Good   Potential Considerations Ability to learn/carryover information;Co-morbidities;Cooperation/participation level;Previous level of function;Severity of impairments;Family/community support;Other (comment)   SLP Home Exercise Plan LSVT-LOUD daily homework   Consulted and Agree with Plan of Care Patient        Problem List There are  no active problems to display for this patient.  Dollene Primrose, MS/CCC- SLP  Monica Watts 07/08/2015, 3:33 PM  [facility] MAIN [facility] SERVICES [address] [place], [place], [address] Phone: [phone]   Fax:  [phone]

## 2015-07-09 ENCOUNTER — Ambulatory Visit: Payer: PPO | Admitting: Speech Pathology

## 2015-07-09 ENCOUNTER — Ambulatory Visit: Payer: PPO | Admitting: Physical Therapy

## 2015-07-09 ENCOUNTER — Encounter: Payer: Self-pay | Admitting: Physical Therapy

## 2015-07-09 ENCOUNTER — Encounter: Payer: Self-pay | Admitting: Speech Pathology

## 2015-07-09 DIAGNOSIS — R29818 Other symptoms and signs involving the nervous system: Secondary | ICD-10-CM

## 2015-07-09 DIAGNOSIS — R262 Difficulty in walking, not elsewhere classified: Secondary | ICD-10-CM

## 2015-07-09 DIAGNOSIS — R490 Dysphonia: Secondary | ICD-10-CM | POA: Diagnosis not present

## 2015-07-09 NOTE — Therapy (Signed)
[facility] MAIN [facility] SERVICES [address] [place], [place], [address] Phone: [phone]   Fax:  [phone]  Speech Language Pathology Treatment  Patient Details  Name: Monica Watts MRN: [id number] Date of Birth: 1946/09/23 Referring Provider:  Danella Penton, MD  Encounter Date: 07/09/2015      End of Session - 07/09/15 1459    Visit Number 7   Number of Visits 17   Date for SLP Re-Evaluation 08/02/15   SLP Start Time 1401   SLP Stop Time  1457   SLP Time Calculation (min) 56 min   Activity Tolerance Patient tolerated treatment well      History reviewed. No pertinent past medical history.  History reviewed. No pertinent past surgical history.  There were no vitals filed for this visit.  Visit Diagnosis: Dysphonia      Subjective Assessment - 07/09/15 1458    Subjective Patient reports that she feels better than she did yesterday.   Currently in Pain? No/denies               ADULT SLP TREATMENT - 07/09/15 0001    General Information   Behavior/Cognition Alert;Cooperative;Pleasant mood   Treatment Provided   Treatment provided Cognitive-Linquistic   Pain Assessment   Pain Assessment No/denies pain   Cognitive-Linquistic Treatment   Treatment focused on Voice  LSVT-LOUD   Skilled Treatment Daily Task #1: Average 14 seconds, 85 dB. Requires min cues for loudness and quality.  Daily Task 2: Highs: 15 high pitched "ah" given min cues. Lows: 15 low pitched "ah" given min cues. Daily task #3: Average 73 dB.  Hierarchal speech loudness drill: Read phrases/sentences, 73 dB. Generate short phrases to answer questions, 72 dB.  Homework: assignments completed.  Off the cuff remarks: average 66 dB, up to 73 dB given min-mod SLP reminder.   Assessment / Recommendations / Plan   Plan Continue with current plan of care   Progression Toward Goals   Progression toward goals Progressing toward goals          SLP Education -  07/09/15 1459    Education provided Yes   Education Details LSVT-LOUD   Person(s) Educated Patient   Methods Explanation;Demonstration;Verbal cues;Handout   Comprehension Verbalized understanding;Returned demonstration;Verbal cues required;Need further instruction            SLP Long Term Goals - 06/25/15 1241    SLP LONG TERM GOAL #1   Title The patient will complete Daily Tasks (Maximum duration "ah", High/Lows, and Functional Phrases) at average loudness of 80 dB and with loud, good quality voice.    Time 4   Period Weeks   Status New   SLP LONG TERM GOAL #2   Title The patient will complete Hierarchal Speech Loudness reading drills (words/phrases, sentences, and paragraph) at average 75 dB and with loud, good quality voice.     Time 4   Period Weeks   Status New   SLP LONG TERM GOAL #3   Title The patient will complete homework daily.   Time 4   Period Weeks   Status New   SLP LONG TERM GOAL #4   Title The patient will participate in conversation, maintaining average loudness of 75 dB and loud, good quality voice.   Time 4   Period Weeks   Status New          Plan - 07/09/15 1500    Clinical Impression Statement The patient is completing daily tasks and  hierarchal speech drill tasks with loud, good quality voice given fewer SLP cues for loudness and quality.  She is demonstrating increased facial expression and emerging generalization of loudness into conversational speech.   Speech Therapy Frequency 4x / week   Duration 4 weeks   Treatment/Interventions Other (comment)  LSVT-LOUD   Potential to Achieve Goals Good   Potential Considerations Ability to learn/carryover information;Co-morbidities;Cooperation/participation level;Previous level of function;Severity of impairments;Family/community support;Other (comment)   SLP Home Exercise Plan LSVT-LOUD daily homework   Consulted and Agree with Plan of Care Patient        Problem List There are no active  problems to display for this patient.  Dollene Primrose, MS/CCC- SLP  Leandrew Koyanagi 07/09/2015, 3:01 PM   [facility] MAIN [facility] SERVICES [address] [place], [place], [address] Phone: [phone]   Fax:  [phone]

## 2015-07-09 NOTE — Therapy (Signed)
[facility] MAIN [facility] SERVICES [address] [place], [place], [address] Phone: [phone]   Fax:  [phone]  Physical Therapy Treatment  Patient Details  Name: Monica Watts MRN: [id number] Date of Birth: 06-08-1946 Referring Provider:  Danella Penton, MD  Encounter Date: 07/09/2015      PT End of Session - 07/09/15 1535    Visit Number 7   Number of Visits 17   Authorization Type 7   PT Start Time 0400   PT Stop Time 0455   PT Time Calculation (min) 55 min   Equipment Utilized During Treatment Gait belt   Activity Tolerance Patient tolerated treatment well   Behavior During Therapy [facility] for tasks assessed/performed      History reviewed. No pertinent past medical history.  History reviewed. No pertinent past surgical history.  There were no vitals filed for this visit.  Visit Diagnosis:  Difficulty walking  Difficulty balancing      Subjective Assessment - 07/09/15 1534    Subjective Patient has  been doing her HEP.         Floor to ceiling x 10 reps, side to side 10 reps, step and reach forward x 10 reps, step and reach backwards x 10, step and reach sideways x 10 , Rock and reach forward/backward x 10 , Rock and reach sideways x 10, functional tasks; 1 sit to stand 2. Coins bank, cards shuffle for improving motor control for buttons and manipulating small items, and opening watter bottles3. Activities to improve seat belt in the car. 4. Raising up legs to get in and out of the car. 5. Get up on the bed. ct  Patient continues to demonstrate some in coordination of movement with select exercises such as rock and reach and stepping backwards. He responds well to verbal and tactile cues to correct form and technique Continues to have balance deficits typical with diagnosis. Patient performs intermediate level exercises without pain behaviors and needs verbal cuing for postural alignment and head positioning Fatigue with sit  to stand but demonstrating more control                           PT Education - 07/09/15 1535    Education provided Yes   Education Details LSVT BIG   Person(s) Educated Patient   Methods Explanation   Comprehension Verbalized understanding             PT Long Term Goals - 06/24/15 1510    PT LONG TERM GOAL #1   Title Patient will increase 10 meter walk test to >1.60m/s as to improve gait speed for better community ambulation and to reduce fall    PT LONG TERM GOAL #2   Title Patient will increase six minute walk test distance to >1000 for progression to community ambulator and improve gait ability   PT LONG TERM GOAL #3   Title Patient will tolerate 5 seconds of single leg stance without loss of balance to improve ability to get in and out of shower safely   PT LONG TERM GOAL #4   Title Patient (< 67 years old) will complete five times sit to stand test in < 10 seconds indicating an increased LE strength and improved balance.               Plan - 07/09/15 1536    Clinical Impression Statement  pt especially has difficulty with opening his hands  up towards the ceiling with multiple exercises and with extending his leg during seated side to side multi-directional reaching activity        Problem List There are no active problems to display for this patient.   Ezekiel Ina 07/09/2015, 3:40 PM  [facility] MAIN [facility] SERVICES [address] [place], [place], [address] Phone: [phone]   Fax:  [phone]

## 2015-07-10 ENCOUNTER — Ambulatory Visit: Payer: PPO | Admitting: Physical Therapy

## 2015-07-10 ENCOUNTER — Encounter: Payer: Self-pay | Admitting: Physical Therapy

## 2015-07-10 ENCOUNTER — Ambulatory Visit: Payer: PPO | Admitting: Speech Pathology

## 2015-07-10 ENCOUNTER — Encounter: Payer: Self-pay | Admitting: Speech Pathology

## 2015-07-10 DIAGNOSIS — R29818 Other symptoms and signs involving the nervous system: Secondary | ICD-10-CM

## 2015-07-10 DIAGNOSIS — R490 Dysphonia: Secondary | ICD-10-CM | POA: Diagnosis not present

## 2015-07-10 DIAGNOSIS — R262 Difficulty in walking, not elsewhere classified: Secondary | ICD-10-CM

## 2015-07-10 NOTE — Therapy (Signed)
[facility] MAIN [facility] SERVICES [address] [place], [place], [address] Phone: [phone]   Fax:  [phone]  Physical Therapy Treatment  Patient Details  Name: Monica Watts MRN: [id number] Date of Birth: 05/09/1946 Referring Provider:  Danella Penton, MD  Encounter Date: 07/10/2015      PT End of Session - 07/10/15 1510    Visit Number 8   Number of Visits 17   Authorization Type 8   Authorization Time Period 10   PT Start Time 0300   PT Stop Time 0355   PT Time Calculation (min) 55 min   Activity Tolerance Patient tolerated treatment well   Behavior During Therapy [facility] for tasks assessed/performed      History reviewed. No pertinent past medical history.  History reviewed. No pertinent past surgical history.  There were no vitals filed for this visit.  Visit Diagnosis:  Difficulty walking  Difficulty balancing      Subjective Assessment - 07/10/15 1510    Subjective Patient has  been doing her HEP.    Currently in Pain? No/denies      Floor to ceiling x 10 reps, side to side 10 reps, step and reach forward x 10 reps, step and reach backwards x 10, step and reach sideways x 10 , Rock and reach forward/backward x 10 , Rock and reach sideways x 10, functional tasks; 1 sit to stand 2. Coins bank, cards shuffle for improving motor control for buttons and manipulating small items, and opening watter bottles3. Activities to improve seat belt in the car. 4. Raising up legs to get in and out of the car. 5. Get up on the bed. ct  Patient continues to demonstrate some in coordination of movement with select exercises such as rock and reach and stepping backwards. He responds well to verbal and tactile cues to correct form and technique                           PT Education - 07/10/15 1510    Education provided Yes   Education Details LSVT BIG   Person(s) Educated Patient   Methods Explanation   Comprehension Verbalized understanding             PT Long Term Goals - 06/24/15 1510    PT LONG TERM GOAL #1   Title Patient will increase 10 meter walk test to >1.76m/s as to improve gait speed for better community ambulation and to reduce fall    PT LONG TERM GOAL #2   Title Patient will increase six minute walk test distance to >1000 for progression to community ambulator and improve gait ability   PT LONG TERM GOAL #3   Title Patient will tolerate 5 seconds of single leg stance without loss of balance to improve ability to get in and out of shower safely   PT LONG TERM GOAL #4   Title Patient (< 37 years old) will complete five times sit to stand test in < 10 seconds indicating an increased LE strength and improved balance.               Plan - 07/10/15 1511    Clinical Impression Statement Patient performs intermediate level exercises without pain behaviors and needs verbal cuing for postural alignment and head positioning.   Rehab Potential Good   PT Frequency 4x / week   PT Duration 4 weeks   PT Treatment/Interventions Balance training;Therapeutic exercise;Therapeutic  activities;Functional mobility training;Stair training;Gait training;Neuromuscular re-education   Consulted and Agree with Plan of Care Patient        Problem List There are no active problems to display for this patient.   Ezekiel Ina 07/10/2015, 3:19 PM  [facility] MAIN [facility] SERVICES [address] [place], [place], [address] Phone: [phone]   Fax:  [phone]

## 2015-07-10 NOTE — Therapy (Signed)
[facility] MAIN [facility] SERVICES [address] [place], [place], [address] Phone: [phone]   Fax:  [phone]  Speech Language Pathology Treatment  Patient Details  Name: Monica Watts MRN: [id number] Date of Birth: 03-15-1946 Referring Provider:  Danella Penton, MD  Encounter Date: 07/10/2015      End of Session - 07/10/15 1647    Visit Number 8   Number of Visits 17   Date for SLP Re-Evaluation 08/02/15   SLP Start Time 1403   SLP Stop Time  1457   SLP Time Calculation (min) 54 min   Activity Tolerance Patient tolerated treatment well      History reviewed. No pertinent past medical history.  History reviewed. No pertinent past surgical history.  There were no vitals filed for this visit.  Visit Diagnosis: Dysphonia      Subjective Assessment - 07/10/15 1646    Subjective Patient reports that she is trying to be LOUD   Currently in Pain? No/denies               ADULT SLP TREATMENT - 07/10/15 0001    General Information   Behavior/Cognition Alert;Cooperative;Pleasant mood   Treatment Provided   Treatment provided Cognitive-Linquistic   Pain Assessment   Pain Assessment No/denies pain   Cognitive-Linquistic Treatment   Treatment focused on Voice  LSVT-LOUD   Skilled Treatment Daily Task #1: Average 14 seconds, 85 dB. Requires min cues for loudness and quality.  Daily Task 2: Highs: 15 high pitched "ah" given min cues. Lows: 15 low pitched "ah" given min cues. Daily task #3: Average 73 dB.  Hierarchal speech loudness drill: Read phrases/sentences, 73 dB. Generate short phrases to answer questions, 72 dB.  Homework: assignments completed.  Off the cuff remarks: average 66 dB, up to 73 dB given min-mod SLP reminder.   Assessment / Recommendations / Plan   Plan Continue with current plan of care   Progression Toward Goals   Progression toward goals Progressing toward goals          SLP Education - 07/10/15 1646     Education provided Yes   Education Details LSVT-LOUD   Person(s) Educated Patient   Methods Explanation;Demonstration;Verbal cues;Handout   Comprehension Verbalized understanding;Returned demonstration;Verbal cues required;Need further instruction            SLP Long Term Goals - 06/25/15 1241    SLP LONG TERM GOAL #1   Title The patient will complete Daily Tasks (Maximum duration "ah", High/Lows, and Functional Phrases) at average loudness of 80 dB and with loud, good quality voice.    Time 4   Period Weeks   Status New   SLP LONG TERM GOAL #2   Title The patient will complete Hierarchal Speech Loudness reading drills (words/phrases, sentences, and paragraph) at average 75 dB and with loud, good quality voice.     Time 4   Period Weeks   Status New   SLP LONG TERM GOAL #3   Title The patient will complete homework daily.   Time 4   Period Weeks   Status New   SLP LONG TERM GOAL #4   Title The patient will participate in conversation, maintaining average loudness of 75 dB and loud, good quality voice.   Time 4   Period Weeks   Status New          Plan - 07/10/15 1647    Clinical Impression Statement The patient is completing daily tasks and hierarchal  speech drill tasks with loud, good quality voice given fewer SLP cues for loudness and quality.  She is demonstrating increased facial expression and emerging generalization of loudness into conversational speech.   Speech Therapy Frequency 4x / week   Duration 4 weeks   Treatment/Interventions Other (comment)  LSVT-LOUD   Potential to Achieve Goals Good   Potential Considerations Ability to learn/carryover information;Co-morbidities;Cooperation/participation level;Previous level of function;Severity of impairments;Family/community support;Other (comment)   SLP Home Exercise Plan LSVT-LOUD daily homework   Consulted and Agree with Plan of Care Patient        Problem List There are no active problems to display  for this patient.  Dollene Primrose, MS/CCC- SLP  Leandrew Koyanagi 07/10/2015, 4:48 PM  [facility] MAIN [facility] SERVICES [address] [place], [place], [address] Phone: [phone]   Fax:  [phone]

## 2015-07-11 ENCOUNTER — Ambulatory Visit: Payer: PPO | Attending: Internal Medicine | Admitting: Speech Pathology

## 2015-07-11 ENCOUNTER — Ambulatory Visit: Payer: PPO | Admitting: Physical Therapy

## 2015-07-11 ENCOUNTER — Encounter: Payer: Self-pay | Admitting: Physical Therapy

## 2015-07-11 DIAGNOSIS — R262 Difficulty in walking, not elsewhere classified: Secondary | ICD-10-CM | POA: Diagnosis present

## 2015-07-11 DIAGNOSIS — R490 Dysphonia: Secondary | ICD-10-CM | POA: Diagnosis present

## 2015-07-11 DIAGNOSIS — R29818 Other symptoms and signs involving the nervous system: Secondary | ICD-10-CM

## 2015-07-11 NOTE — Therapy (Signed)
[facility] MAIN [facility] SERVICES [address] [place], [place], [address] Phone: [phone]   Fax:  [phone]  Physical Therapy Treatment  Patient Details  Name: Monica Watts MRN: [id number] Date of Birth: 1946-02-11 Referring Provider:  Danella Penton, MD  Encounter Date: 07/11/2015      PT End of Session - 07/11/15 1527    Visit Number 9   Number of Visits 17   Authorization Type 9   Authorization Time Period 10   PT Start Time 0310   PT Stop Time 0355   PT Time Calculation (min) 45 min   Equipment Utilized During Treatment Gait belt   Activity Tolerance Patient tolerated treatment well   Behavior During Therapy [facility] for tasks assessed/performed      History reviewed. No pertinent past medical history.  History reviewed. No pertinent past surgical history.  There were no vitals filed for this visit.  Visit Diagnosis:  Difficulty walking  Difficulty balancing      Subjective Assessment - 07/11/15 1527    Subjective Patient went to a support meet meeting for parkinsons.   Currently in Pain? No/denies        Floor to ceiling x 10 reps, side to side 10 reps, step and reach forward x 10 reps, step and reach backwards x 10, step and reach sideways x 10 , Rock and reach forward/backward x 10 , Rock and reach sideways x 10, functional tasks; 1 sit to stand 2. Coins bank, cards shuffle for improving motor control for buttons and manipulating small items, and opening watter bottles3. Activities to improve seat belt in the car. 4. Raising up legs to get in and out of the car. 5. Get up on the bed. Patient is able to catch herself with incorrect positions and is able remember the start and finish positions. Patient has difficulty with turning his head and rotating  trunk with weight shifting exercises.  Patient continues to demonstrate some in coordination of movement with select exercises such as rock and reach and stepping backwards.  She responds well to verbal and tactile cues to correct form and technique                          PT Education - 07/11/15 1527    Education provided Yes   Person(s) Educated Patient   Methods Explanation   Comprehension Verbalized understanding             PT Long Term Goals - 06/24/15 1510    PT LONG TERM GOAL #1   Title Patient will increase 10 meter walk test to >1.68m/s as to improve gait speed for better community ambulation and to reduce fall    PT LONG TERM GOAL #2   Title Patient will increase six minute walk test distance to >1000 for progression to community ambulator and improve gait ability   PT LONG TERM GOAL #3   Title Patient will tolerate 5 seconds of single leg stance without loss of balance to improve ability to get in and out of shower safely   PT LONG TERM GOAL #4   Title Patient (< 21 years old) will complete five times sit to stand test in < 10 seconds indicating an increased LE strength and improved balance.               Plan - 07/11/15 1532    Clinical Impression Statement Patient has fatigue with standing exercises  and needs constant VC to have correct posturePatient has loss of balance and needs UE support intermittently thorough out exercise. Patient has slowness of movement during rotation and beginning movements   Rehab Potential Good   PT Frequency 4x / week   PT Duration 4 weeks   PT Treatment/Interventions Balance training;Therapeutic exercise;Therapeutic activities;Functional mobility training;Stair training;Gait training;Neuromuscular re-education   Consulted and Agree with Plan of Care Patient        Problem List There are no active problems to display for this patient.   Ezekiel Ina 07/11/2015, 3:44 PM  [facility] MAIN [facility] SERVICES [address] [place], [place], [address] Phone: [phone]   Fax:  [phone]

## 2015-07-12 ENCOUNTER — Encounter: Payer: Self-pay | Admitting: Speech Pathology

## 2015-07-12 NOTE — Therapy (Signed)
[place] [facility] MAIN [facility] SERVICES [address] [place], [place], [address] Phone: [phone]   Fax:  [phone]  Speech Language Pathology Treatment  Patient Details  Name: Monica Watts MRN: [id number] Date of Birth: 02-17-1946 Referring Provider:  Danella Penton, MD  Encounter Date: 07/11/2015      End of Session - 07/12/15 0835    Visit Number 9   Number of Visits 17   Date for SLP Re-Evaluation 08/02/15   SLP Start Time 1400   SLP Stop Time  1456   SLP Time Calculation (min) 56 min   Activity Tolerance Patient tolerated treatment well      History reviewed. No pertinent past medical history.  History reviewed. No pertinent past surgical history.  There were no vitals filed for this visit.  Visit Diagnosis: Dysphonia      Subjective Assessment - 07/12/15 0835    Subjective Patient reports that she is trying to be LOUD   Currently in Pain? No/denies               ADULT SLP TREATMENT - 07/12/15 0001    General Information   Behavior/Cognition Alert;Cooperative;Pleasant mood   Treatment Provided   Treatment provided Cognitive-Linquistic   Pain Assessment   Pain Assessment No/denies pain   Cognitive-Linquistic Treatment   Treatment focused on Voice  LSVT-LOUD   Skilled Treatment Daily Task #1: Average 14 seconds, 85 dB. Requires min cues for loudness and quality.  Daily Task 2: Highs: 15 high pitched "ah" given min cues. Lows: 15 low pitched "ah" given min cues. Daily task #3: Average 73 dB.  Hierarchal speech loudness drill: Read phrases/sentences, 73 dB. Generate short phrases to answer questions, 72 dB.  Homework: assignments completed.  Off the cuff remarks: average 66 dB, up to 73 dB given min-mod SLP reminder.   Assessment / Recommendations / Plan   Plan Continue with current plan of care   Progression Toward Goals   Progression toward goals Progressing toward goals          SLP Education - 07/12/15 0835     Education provided Yes   Education Details LSVT-LOUD   Person(s) Educated Patient   Methods Explanation;Demonstration;Verbal cues;Handout   Comprehension Verbalized understanding;Returned demonstration;Need further instruction            SLP Long Term Goals - 06/25/15 1241    SLP LONG TERM GOAL #1   Title The patient will complete Daily Tasks (Maximum duration "ah", High/Lows, and Functional Phrases) at average loudness of 80 dB and with loud, good quality voice.    Time 4   Period Weeks   Status New   SLP LONG TERM GOAL #2   Title The patient will complete Hierarchal Speech Loudness reading drills (words/phrases, sentences, and paragraph) at average 75 dB and with loud, good quality voice.     Time 4   Period Weeks   Status New   SLP LONG TERM GOAL #3   Title The patient will complete homework daily.   Time 4   Period Weeks   Status New   SLP LONG TERM GOAL #4   Title The patient will participate in conversation, maintaining average loudness of 75 dB and loud, good quality voice.   Time 4   Period Weeks   Status New          Plan - 07/12/15 6962    Clinical Impression Statement The patient is completing daily tasks and hierarchal speech drill  tasks with loud, good quality voice given fewer SLP cues for loudness and quality.  She is demonstrating increased facial expression and emerging generalization of loudness into conversational speech.   Speech Therapy Frequency 4x / week   Duration 4 weeks   Treatment/Interventions Other (comment)  LSVT-LOUD   Potential to Achieve Goals Good   Potential Considerations Ability to learn/carryover information;Co-morbidities;Cooperation/participation level;Previous level of function;Severity of impairments;Family/community support;Other (comment)   SLP Home Exercise Plan LSVT-LOUD daily homework   Consulted and Agree with Plan of Care Patient        Problem List There are no active problems to display for this  patient.  Dollene Primrose, MS/CCC- SLP  Leandrew Koyanagi 07/12/2015, 8:37 AM  [place] [facility] MAIN [facility] SERVICES [address] [place], [place], [address] Phone: [phone]   Fax:  [phone]

## 2015-07-16 ENCOUNTER — Ambulatory Visit: Payer: PPO | Admitting: Physical Therapy

## 2015-07-16 ENCOUNTER — Encounter: Payer: Self-pay | Admitting: Physical Therapy

## 2015-07-16 ENCOUNTER — Ambulatory Visit: Payer: PPO | Admitting: Speech Pathology

## 2015-07-16 DIAGNOSIS — R262 Difficulty in walking, not elsewhere classified: Secondary | ICD-10-CM

## 2015-07-16 DIAGNOSIS — R490 Dysphonia: Secondary | ICD-10-CM

## 2015-07-16 DIAGNOSIS — R29818 Other symptoms and signs involving the nervous system: Secondary | ICD-10-CM

## 2015-07-16 NOTE — Therapy (Signed)
[place] [facility] MAIN [facility] SERVICES [address] [place], [place], [address] Phone: [phone]   Fax:  [phone]  Physical Therapy Treatment  Patient Details  Name: Monica Watts MRN: [id number] Date of Birth: 11/22/1945 Referring Provider:  Danella Penton, MD  Encounter Date: 07/16/2015      PT End of Session - 07/16/15 1513    Visit Number 10   Number of Visits 17   Authorization Type 10   Authorization Time Period 10   PT Start Time 0300   PT Stop Time 0400   PT Time Calculation (min) 60 min   Activity Tolerance Patient tolerated treatment well   Behavior During Therapy [facility] for tasks assessed/performed      History reviewed. No pertinent past medical history.  History reviewed. No pertinent past surgical history.  There were no vitals filed for this visit.  Visit Diagnosis:  Difficulty walking  Difficulty balancing      Subjective Assessment - 07/16/15 1511    Subjective Patient is doing well today.    Currently in Pain? No/denies           Floor to ceiling x 10 reps, side to side 10 reps, step and reach forward x 10 reps, step and reach backwards x 10, step and reach sideways x 10 , Rock and reach forward/backward x 10 , Rock and reach sideways x 10, functional tasks; 1 sit to stand 2. Coins bank, cards shuffle for improving motor control for buttons and manipulating small items, and opening watter bottles3. Activities to improve seat belt in the car. 4. Raising up legs to get in and out of the car. 5. Get up on the bed. ct  Patient continues to demonstrate some in coordination of movement with select exercises such as rock and reach and stepping backwards. He responds well to verbal and tactile cues to correct form and technique         TUG 9.70 sec 10 MW = 1.00 m/sec 5 x sit to stand 12.73 6 MW1300 Peg R 45.67   Peg L 40.70        c                   PT Education - 07/16/15 1512    Education provided Yes   Education Details LSVT BIG   Person(s) Educated Patient   Methods Explanation   Comprehension Verbalized understanding             PT Long Term Goals - 06/24/15 1510    PT LONG TERM GOAL #1   Title Patient will increase 10 meter walk test to >1.66m/s as to improve gait speed for better community ambulation and to reduce fall    PT LONG TERM GOAL #2   Title Patient will increase six minute walk test distance to >1000 for progression to community ambulator and improve gait ability   PT LONG TERM GOAL #3   Title Patient will tolerate 5 seconds of single leg stance without loss of balance to improve ability to get in and out of shower safely   PT LONG TERM GOAL #4   Title Patient (< 8 years old) will complete five times sit to stand test in < 10 seconds indicating an increased LE strength and improved balance.               Plan - 07/16/15 1525    Clinical Impression Statement Patient continues to demonstrate some in coordination of  movement with select exercises such as rock and reach and stepping backwards. He responds well to verbal and tactile cues to correct form and technique.    Rehab Potential Good   PT Frequency 4x / week   PT Duration 2 weeks   PT Treatment/Interventions Balance training;Therapeutic exercise;Therapeutic activities;Functional mobility training;Stair training;Gait training;Neuromuscular re-education   Consulted and Agree with Plan of Care Patient        Problem List There are no active problems to display for this patient.   Ezekiel Ina 07/16/2015, 3:27 PM  [place] [facility] MAIN [facility] SERVICES [address] [place], [place], [address] Phone: [phone]   Fax:  [phone]

## 2015-07-17 ENCOUNTER — Encounter: Payer: Self-pay | Admitting: Speech Pathology

## 2015-07-17 ENCOUNTER — Encounter: Payer: Self-pay | Admitting: Physical Therapy

## 2015-07-17 ENCOUNTER — Ambulatory Visit: Payer: PPO | Admitting: Physical Therapy

## 2015-07-17 ENCOUNTER — Ambulatory Visit: Payer: PPO | Admitting: Speech Pathology

## 2015-07-17 DIAGNOSIS — R262 Difficulty in walking, not elsewhere classified: Secondary | ICD-10-CM

## 2015-07-17 DIAGNOSIS — R490 Dysphonia: Secondary | ICD-10-CM | POA: Diagnosis not present

## 2015-07-17 DIAGNOSIS — R29818 Other symptoms and signs involving the nervous system: Secondary | ICD-10-CM

## 2015-07-17 NOTE — Therapy (Signed)
[place] MAIN [facility] SERVICES [address] [place], [place], [address] Phone: [phone]   Fax:  [phone]  Physical Therapy Treatment  Patient Details  Name: Monica Watts MRN: [id number] Date of Birth: December 01, 1945 Referring Provider:  Rusty Aus, MD  Encounter Date: 07/17/2015      PT End of Session - 07/17/15 1545    Visit Number 11   Number of Visits 17   Authorization Type 10   PT Start Time 0300   PT Stop Time 0400   PT Time Calculation (min) 60 min   Equipment Utilized During Treatment Gait belt   Activity Tolerance Patient tolerated treatment well   Behavior During Therapy [facility] for tasks assessed/performed      History reviewed. No pertinent past medical history.  History reviewed. No pertinent past surgical history.  There were no vitals filed for this visit.  Visit Diagnosis:  Difficulty walking  Difficulty balancing      Subjective Assessment - 07/17/15 1544    Subjective Patient is doing well today.        Floor to ceiling x 10 reps, side to side 10 reps, step and reach forward x 10 reps, step and reach backwards x 10, step and reach sideways x 10 , Rock and reach forward/backward x 10 , Rock and reach sideways x 10, functional tasks; 1 sit to stand 2. Coins bank, cards shuffle for improving motor control for buttons and manipulating small items, and opening watter bottles3. Activities to improve seat belt in the car. 4. Raising up legs to get in and out of the car. 5. Get up on the bed. Continues to have balance deficits typical with diagnosis. Patient performs beginning level exercises without pain behaviors and needs verbal cuing for postural alignment and head positioning Decreased coordination demonstrated requiring consistent verbal cueing to correct form                       PT Education - 07/17/15 1545    Education provided Yes   Education Details LSVT BIG   Person(s) Educated  Patient   Methods Explanation   Comprehension Verbalized understanding             PT Long Term Goals - 2015-07-22 1548    PT LONG TERM GOAL #1   Status Partially Met   PT LONG TERM GOAL #2   Status Partially Met   PT LONG TERM GOAL #3   Status Partially Met   PT LONG TERM GOAL #4   Status Partially Met               Plan - 07/17/15 1546    Clinical Impression Statement Patient has poor static and dynamic standing balance and needs frequent use of hands to steady herself and keep from losing her balance.   Pt will benefit from skilled therapeutic intervention in order to improve on the following deficits Abnormal gait;Decreased endurance;Impaired sensation;Pain;Decreased activity tolerance;Decreased strength;Decreased balance;Decreased mobility;Difficulty walking;Decreased coordination;Decreased safety awareness   Rehab Potential Good   PT Frequency 4x / week   PT Duration 2 weeks   PT Treatment/Interventions Balance training;Therapeutic exercise;Therapeutic activities;Functional mobility training;Stair training;Gait training;Neuromuscular re-education   Consulted and Agree with Plan of Care Patient          G-Codes - 2015/07/22 1548    Functional Assessment Tool Used TUG, 5 x sit to stand, 10 MW, 6 MW   Functional Limitation Mobility: Walking and moving around  Mobility: Walking and Moving Around Current Status [phone]) At least 20 percent but less than 40 percent impaired, limited or restricted   Mobility: Walking and Moving Around Goal Status [phone]) At least 1 percent but less than 20 percent impaired, limited or restricted      Problem List There are no active problems to display for this patient.   Alanson Puls 07/17/2015, 3:48 PM  [place] MAIN [facility] SERVICES [address] [place], [place], [address] Phone: [phone]   Fax:  [phone]

## 2015-07-17 NOTE — Therapy (Signed)
[place] MAIN [facility] SERVICES [address] [place], [place], [address] Phone: [phone]   Fax:  [phone]  Speech Language Pathology Treatment/Progress Note  Patient Details  Name: Monica Watts MRN: [id number] Date of Birth: January 02, 1946 Referring Provider:  Rusty Aus, MD  Encounter Date: 07/16/2015      End of Session - 07/17/15 0936    Visit Number 10   Number of Visits 17   Date for SLP Re-Evaluation 08/02/15   SLP Start Time 1400   SLP Stop Time  1454   SLP Time Calculation (min) 54 min   Activity Tolerance Patient tolerated treatment well      History reviewed. No pertinent past medical history.  History reviewed. No pertinent past surgical history.  There were no vitals filed for this visit.  Visit Diagnosis: Dysphonia      Subjective Assessment - 07/17/15 0936    Subjective Patient reports that she is trying to be LOUD   Currently in Pain? No/denies               ADULT SLP TREATMENT - 07/17/15 0001    General Information   Behavior/Cognition Alert;Cooperative;Pleasant mood   Treatment Provided   Treatment provided Cognitive-Linquistic   Pain Assessment   Pain Assessment No/denies pain   Cognitive-Linquistic Treatment   Treatment focused on Voice  LSVT-LOUD   Skilled Treatment Daily Task #1: Average 14 seconds, 85 dB. Requires min cues for loudness and quality.  Daily Task 2: Highs: 15 high pitched "ah" given min cues. Lows: 15 low pitched "ah" given min cues. Daily task #3: Average 75 dB.  Hierarchal speech loudness drill: Read phrases/sentences, 73 dB. Generate short phrases to answer questions, 72 dB.  Homework: assignments completed.  Off the cuff remarks: average 66 dB, up to 73 dB given min-mod SLP reminder.   Assessment / Recommendations / Plan   Plan Continue with current plan of care   Progression Toward Goals   Progression toward goals Progressing toward goals          SLP Education -  07/17/15 0936    Education provided Yes   Education Details LSVT-LOUD   Person(s) Educated Patient   Methods Explanation;Demonstration;Verbal cues;Handout   Comprehension Verbalized understanding;Returned demonstration;Verbal cues required;Need further instruction            SLP Long Term Goals - 07/17/15 9735    SLP LONG TERM GOAL #1   Title The patient will complete Daily Tasks (Maximum duration "ah", High/Lows, and Functional Phrases) at average loudness of 80 dB and with loud, good quality voice.    Time 4   Period Weeks   Status Partially Met   SLP LONG TERM GOAL #2   Title The patient will complete Hierarchal Speech Loudness reading drills (words/phrases, sentences, and paragraph) at average 75 dB and with loud, good quality voice.     Time 4   Period Weeks   Status Partially Met   SLP LONG TERM GOAL #3   Title The patient will complete homework daily.   Time 4   Period Weeks   Status On-going   SLP LONG TERM GOAL #4   Title The patient will participate in conversation, maintaining average loudness of 75 dB and loud, good quality voice.   Time 4   Period Weeks   Status Partially Met          Plan - 07/17/15 3299    Clinical Impression Statement The patient is completing  daily tasks and hierarchal speech drill tasks with loud, good quality voice given fewer SLP cues for loudness and quality.  She continues to require fairly constant cueing to use the LOUD voice in structured conversation with SLP.  She is demonstrating increased facial expression and emerging generalization of loudness into conversational speech.   Speech Therapy Frequency 4x / week   Duration 4 weeks   Treatment/Interventions Other (comment)  LSVT-LOUD   Potential Considerations Ability to learn/carryover information;Co-morbidities;Cooperation/participation level;Previous level of function;Severity of impairments;Family/community support;Other (comment)   SLP Home Exercise Plan LSVT-LOUD daily  homework   Consulted and Agree with Plan of Care Patient          G-Codes - 08/13/15 0938    Functional Assessment Tool Used LSVT-LOUD Protocol   Functional Limitations Voice   Voice Current Status (G9171) At least 40 percent but less than 60 percent impaired, limited or restricted   Voice Goal Status (G9172) At least 1 percent but less than 20 percent impaired, limited or restricted      Problem List There are no active problems to display for this patient.  Leroy Sea, MS/CCC- SLP  Lou Miner August 13, 2015, 9:39 AM  [place] MAIN [facility] SERVICES [address] [place], [place], [address] Phone: [phone]   Fax:  [phone]

## 2015-07-18 ENCOUNTER — Encounter: Payer: Self-pay | Admitting: Speech Pathology

## 2015-07-18 ENCOUNTER — Ambulatory Visit: Payer: PPO | Admitting: Speech Pathology

## 2015-07-18 ENCOUNTER — Ambulatory Visit: Payer: PPO | Admitting: Physical Therapy

## 2015-07-18 NOTE — Therapy (Signed)
[place] MAIN [facility] SERVICES [address] [place], [place], [address] Phone: [phone]   Fax:  [phone]  Speech Language Pathology Treatment  Patient Details  Name: LEILA SCHUFF MRN: [id number] Date of Birth: February 13, 1946 Referring Provider:  Rusty Aus, MD  Encounter Date: 07/17/2015      End of Session - 07/18/15 1056    Visit Number 11   Number of Visits 17   Date for SLP Re-Evaluation 08/02/15   SLP Start Time 1400   SLP Stop Time  6659   SLP Time Calculation (min) 56 min   Activity Tolerance Patient tolerated treatment well      History reviewed. No pertinent past medical history.  History reviewed. No pertinent past surgical history.  There were no vitals filed for this visit.  Visit Diagnosis: Dysphonia      Subjective Assessment - 07/18/15 1056    Subjective Patient reports that she is trying to be LOUD   Currently in Pain? No/denies               ADULT SLP TREATMENT - 07/18/15 0001    General Information   Behavior/Cognition Alert;Cooperative;Pleasant mood   Treatment Provided   Treatment provided Cognitive-Linquistic   Pain Assessment   Pain Assessment No/denies pain   Cognitive-Linquistic Treatment   Treatment focused on Voice  LSVT-LOUD   Skilled Treatment Daily Task #1: Average 13 seconds, 83 dB. Requires min-mod cues for loudness and quality.  Daily Task 2: Highs: 15 high pitched "ah" given min cues. Lows: 15 low pitched "ah" given min cues. Daily task #3: Average 73 dB.  Hierarchal speech loudness drill: Read phrases/sentences, 71 dB. Generate short phrases to answer questions, 70 dB.  Homework: assignments completed.  Off the cuff remarks: average 66 dB, up to 73 dB given min-mod SLP reminder.   Assessment / Recommendations / Plan   Plan Continue with current plan of care   Progression Toward Goals   Progression toward goals Progressing toward goals          SLP Education - 07/18/15  1056    Education provided Yes   Education Details LSVT-LOUD   Person(s) Educated Patient   Methods Explanation;Demonstration;Verbal cues;Handout   Comprehension Verbalized understanding;Returned demonstration;Verbal cues required;Need further instruction            SLP Long Term Goals - 07/17/15 9357    SLP LONG TERM GOAL #1   Title The patient will complete Daily Tasks (Maximum duration "ah", High/Lows, and Functional Phrases) at average loudness of 80 dB and with loud, good quality voice.    Time 4   Period Weeks   Status Partially Met   SLP LONG TERM GOAL #2   Title The patient will complete Hierarchal Speech Loudness reading drills (words/phrases, sentences, and paragraph) at average 75 dB and with loud, good quality voice.     Time 4   Period Weeks   Status Partially Met   SLP LONG TERM GOAL #3   Title The patient will complete homework daily.   Time 4   Period Weeks   Status On-going   SLP LONG TERM GOAL #4   Title The patient will participate in conversation, maintaining average loudness of 75 dB and loud, good quality voice.   Time 4   Period Weeks   Status Partially Met          Plan - 07/18/15 1057    Clinical Impression Statement The patient is completing daily  tasks and hierarchal speech drill tasks with loud, good quality voice given SLP cues for loudness and quality.  She is demonstrating increased facial expression and emerging generalization of loudness into conversational speech.   Speech Therapy Frequency 4x / week   Duration 4 weeks   Treatment/Interventions Other (comment)  LSVT-LOUD   Potential to Achieve Goals Good   Potential Considerations Ability to learn/carryover information;Co-morbidities;Cooperation/participation level;Previous level of function;Severity of impairments;Family/community support;Other (comment)   SLP Home Exercise Plan LSVT-LOUD daily homework   Consulted and Agree with Plan of Care Patient          G-Codes - 23-Jul-2015  0938    Functional Assessment Tool Used LSVT-LOUD Protocol   Functional Limitations Voice   Voice Current Status (G9171) At least 40 percent but less than 60 percent impaired, limited or restricted   Voice Goal Status (G9172) At least 1 percent but less than 20 percent impaired, limited or restricted      Problem List There are no active problems to display for this patient.  Leroy Sea, MS/CCC- SLP  Lou Miner 07/18/2015, 10:58 AM  [place] MAIN [facility] SERVICES [address] [place], [place], [address] Phone: [phone]   Fax:  [phone]

## 2015-07-22 ENCOUNTER — Ambulatory Visit: Payer: PPO | Admitting: Physical Therapy

## 2015-07-22 ENCOUNTER — Ambulatory Visit: Payer: PPO | Admitting: Speech Pathology

## 2015-07-23 ENCOUNTER — Ambulatory Visit: Payer: PPO | Admitting: Physical Therapy

## 2015-07-23 ENCOUNTER — Other Ambulatory Visit: Payer: Self-pay | Admitting: Internal Medicine

## 2015-07-23 ENCOUNTER — Encounter: Payer: PPO | Admitting: Speech Pathology

## 2015-07-23 DIAGNOSIS — Z1231 Encounter for screening mammogram for malignant neoplasm of breast: Secondary | ICD-10-CM

## 2015-07-24 ENCOUNTER — Ambulatory Visit: Payer: PPO | Admitting: Physical Therapy

## 2015-07-24 ENCOUNTER — Encounter: Payer: PPO | Admitting: Speech Pathology

## 2015-07-25 ENCOUNTER — Encounter: Payer: PPO | Admitting: Speech Pathology

## 2015-07-25 ENCOUNTER — Ambulatory Visit: Payer: PPO | Admitting: Physical Therapy

## 2015-07-29 ENCOUNTER — Ambulatory Visit: Payer: PPO | Admitting: Physical Therapy

## 2015-07-29 ENCOUNTER — Encounter: Payer: PPO | Admitting: Speech Pathology

## 2015-08-13 ENCOUNTER — Ambulatory Visit
Admission: RE | Admit: 2015-08-13 | Discharge: 2015-08-13 | Disposition: A | Discharge status: Home / Self Care | Payer: PPO | Source: Ambulatory Visit | Attending: Internal Medicine | Admitting: Internal Medicine

## 2015-08-13 DIAGNOSIS — Z1231 Encounter for screening mammogram for malignant neoplasm of breast: Secondary | ICD-10-CM | POA: Insufficient documentation

## 2015-09-23 ENCOUNTER — Ambulatory Visit: Payer: PPO | Admitting: Speech Pathology

## 2015-09-23 ENCOUNTER — Ambulatory Visit: Payer: PPO | Admitting: Occupational Therapy

## 2015-09-30 ENCOUNTER — Encounter: Payer: PPO | Admitting: Speech Pathology

## 2015-09-30 ENCOUNTER — Encounter: Payer: PPO | Admitting: Occupational Therapy

## 2015-10-01 ENCOUNTER — Encounter: Payer: PPO | Admitting: Speech Pathology

## 2015-10-01 ENCOUNTER — Encounter: Payer: PPO | Admitting: Occupational Therapy

## 2015-10-02 ENCOUNTER — Encounter: Payer: PPO | Admitting: Speech Pathology

## 2015-10-02 ENCOUNTER — Encounter: Payer: PPO | Admitting: Occupational Therapy

## 2015-10-04 ENCOUNTER — Encounter: Payer: PPO | Admitting: Occupational Therapy

## 2015-10-04 ENCOUNTER — Encounter: Payer: PPO | Admitting: Speech Pathology

## 2015-10-07 ENCOUNTER — Encounter: Payer: PPO | Admitting: Speech Pathology

## 2015-10-07 ENCOUNTER — Encounter: Payer: PPO | Admitting: Occupational Therapy

## 2015-10-08 ENCOUNTER — Encounter: Payer: PPO | Admitting: Speech Pathology

## 2015-10-08 ENCOUNTER — Encounter: Payer: PPO | Admitting: Occupational Therapy

## 2015-10-09 ENCOUNTER — Encounter: Payer: PPO | Admitting: Speech Pathology

## 2015-10-09 ENCOUNTER — Encounter: Payer: PPO | Admitting: Occupational Therapy

## 2015-10-10 ENCOUNTER — Encounter: Payer: PPO | Admitting: Occupational Therapy

## 2015-10-10 ENCOUNTER — Encounter: Payer: PPO | Admitting: Speech Pathology

## 2015-10-14 ENCOUNTER — Encounter: Payer: PPO | Admitting: Occupational Therapy

## 2015-10-14 ENCOUNTER — Encounter: Payer: PPO | Admitting: Speech Pathology

## 2015-10-15 ENCOUNTER — Encounter: Payer: PPO | Admitting: Speech Pathology

## 2015-10-15 ENCOUNTER — Encounter: Payer: PPO | Admitting: Occupational Therapy

## 2015-10-16 ENCOUNTER — Encounter: Payer: PPO | Admitting: Speech Pathology

## 2015-10-16 ENCOUNTER — Encounter: Payer: PPO | Admitting: Occupational Therapy

## 2015-10-17 ENCOUNTER — Encounter: Payer: PPO | Admitting: Speech Pathology

## 2015-10-17 ENCOUNTER — Encounter: Payer: PPO | Admitting: Occupational Therapy

## 2015-10-21 ENCOUNTER — Encounter: Payer: PPO | Admitting: Speech Pathology

## 2015-10-21 ENCOUNTER — Encounter: Payer: PPO | Admitting: Occupational Therapy

## 2015-10-22 ENCOUNTER — Encounter: Payer: PPO | Admitting: Occupational Therapy

## 2015-10-22 ENCOUNTER — Encounter: Payer: PPO | Admitting: Speech Pathology

## 2015-10-23 ENCOUNTER — Encounter: Payer: PPO | Admitting: Speech Pathology

## 2015-10-23 ENCOUNTER — Encounter: Payer: PPO | Admitting: Occupational Therapy

## 2015-10-24 ENCOUNTER — Encounter: Payer: PPO | Admitting: Speech Pathology

## 2015-10-24 ENCOUNTER — Encounter: Payer: PPO | Admitting: Occupational Therapy

## 2015-12-26 DIAGNOSIS — G20 Parkinson's disease: Secondary | ICD-10-CM | POA: Diagnosis not present

## 2015-12-26 DIAGNOSIS — J40 Bronchitis, not specified as acute or chronic: Secondary | ICD-10-CM | POA: Diagnosis not present

## 2016-02-04 DIAGNOSIS — Z780 Asymptomatic menopausal state: Secondary | ICD-10-CM | POA: Diagnosis not present

## 2016-06-30 DIAGNOSIS — E782 Mixed hyperlipidemia: Secondary | ICD-10-CM | POA: Diagnosis not present

## 2016-07-07 DIAGNOSIS — G20 Parkinson's disease: Secondary | ICD-10-CM | POA: Diagnosis not present

## 2016-07-07 DIAGNOSIS — Z23 Encounter for immunization: Secondary | ICD-10-CM | POA: Diagnosis not present

## 2016-07-07 DIAGNOSIS — Z0000 Encounter for general adult medical examination without abnormal findings: Secondary | ICD-10-CM | POA: Diagnosis not present

## 2016-07-10 ENCOUNTER — Other Ambulatory Visit: Payer: Self-pay | Admitting: Internal Medicine

## 2016-07-10 DIAGNOSIS — Z1231 Encounter for screening mammogram for malignant neoplasm of breast: Secondary | ICD-10-CM

## 2016-08-17 DIAGNOSIS — R1314 Dysphagia, pharyngoesophageal phase: Secondary | ICD-10-CM | POA: Diagnosis not present

## 2016-08-17 DIAGNOSIS — R490 Dysphonia: Secondary | ICD-10-CM | POA: Diagnosis not present

## 2016-09-22 ENCOUNTER — Ambulatory Visit
Admission: RE | Admit: 2016-09-22 | Discharge: 2016-09-22 | Disposition: A | Discharge status: Home / Self Care | Payer: PPO | Source: Ambulatory Visit | Attending: Internal Medicine | Admitting: Internal Medicine

## 2016-09-22 DIAGNOSIS — Z1231 Encounter for screening mammogram for malignant neoplasm of breast: Secondary | ICD-10-CM | POA: Diagnosis not present

## 2017-01-07 DIAGNOSIS — M5136 Other intervertebral disc degeneration, lumbar region: Secondary | ICD-10-CM | POA: Diagnosis not present

## 2017-01-07 DIAGNOSIS — M1712 Unilateral primary osteoarthritis, left knee: Secondary | ICD-10-CM | POA: Diagnosis not present

## 2017-01-12 DIAGNOSIS — Z0000 Encounter for general adult medical examination without abnormal findings: Secondary | ICD-10-CM | POA: Diagnosis not present

## 2017-01-12 DIAGNOSIS — G243 Spasmodic torticollis: Secondary | ICD-10-CM | POA: Diagnosis not present

## 2017-01-12 DIAGNOSIS — M48062 Spinal stenosis, lumbar region with neurogenic claudication: Secondary | ICD-10-CM | POA: Diagnosis not present

## 2017-01-12 DIAGNOSIS — G20 Parkinson's disease: Secondary | ICD-10-CM | POA: Diagnosis not present

## 2017-01-20 DIAGNOSIS — G243 Spasmodic torticollis: Secondary | ICD-10-CM | POA: Diagnosis not present

## 2017-01-20 DIAGNOSIS — G20 Parkinson's disease: Secondary | ICD-10-CM | POA: Diagnosis not present

## 2017-05-04 DIAGNOSIS — G243 Spasmodic torticollis: Secondary | ICD-10-CM | POA: Diagnosis not present

## 2017-05-04 DIAGNOSIS — G20 Parkinson's disease: Secondary | ICD-10-CM | POA: Diagnosis not present

## 2017-07-20 DIAGNOSIS — E782 Mixed hyperlipidemia: Secondary | ICD-10-CM | POA: Diagnosis not present

## 2017-07-20 DIAGNOSIS — M48062 Spinal stenosis, lumbar region with neurogenic claudication: Secondary | ICD-10-CM | POA: Diagnosis not present

## 2017-07-27 DIAGNOSIS — M48062 Spinal stenosis, lumbar region with neurogenic claudication: Secondary | ICD-10-CM | POA: Diagnosis not present

## 2017-07-27 DIAGNOSIS — Z0000 Encounter for general adult medical examination without abnormal findings: Secondary | ICD-10-CM | POA: Diagnosis not present

## 2017-08-12 ENCOUNTER — Other Ambulatory Visit: Payer: Self-pay | Admitting: Internal Medicine

## 2017-08-12 DIAGNOSIS — Z1231 Encounter for screening mammogram for malignant neoplasm of breast: Secondary | ICD-10-CM

## 2018-01-11 DIAGNOSIS — R7989 Other specified abnormal findings of blood chemistry: Secondary | ICD-10-CM | POA: Diagnosis not present

## 2018-01-11 DIAGNOSIS — Z79899 Other long term (current) drug therapy: Secondary | ICD-10-CM | POA: Diagnosis not present

## 2018-01-11 DIAGNOSIS — Z0000 Encounter for general adult medical examination without abnormal findings: Secondary | ICD-10-CM | POA: Diagnosis not present

## 2018-01-11 DIAGNOSIS — E782 Mixed hyperlipidemia: Secondary | ICD-10-CM | POA: Diagnosis not present

## 2018-01-11 DIAGNOSIS — G20 Parkinson's disease: Secondary | ICD-10-CM | POA: Diagnosis not present

## 2018-04-06 DIAGNOSIS — Z961 Presence of intraocular lens: Secondary | ICD-10-CM | POA: Diagnosis not present

## 2018-04-06 DIAGNOSIS — H4720 Unspecified optic atrophy: Secondary | ICD-10-CM | POA: Diagnosis not present

## 2018-04-06 DIAGNOSIS — H5202 Hypermetropia, left eye: Secondary | ICD-10-CM | POA: Diagnosis not present

## 2018-04-06 DIAGNOSIS — H16143 Punctate keratitis, bilateral: Secondary | ICD-10-CM | POA: Diagnosis not present

## 2018-06-09 DIAGNOSIS — R102 Pelvic and perineal pain: Secondary | ICD-10-CM | POA: Diagnosis not present

## 2018-06-09 DIAGNOSIS — G20 Parkinson's disease: Secondary | ICD-10-CM | POA: Diagnosis not present

## 2018-06-09 DIAGNOSIS — M160 Bilateral primary osteoarthritis of hip: Secondary | ICD-10-CM | POA: Diagnosis not present

## 2018-06-09 DIAGNOSIS — M25552 Pain in left hip: Secondary | ICD-10-CM | POA: Diagnosis not present

## 2018-06-09 DIAGNOSIS — M25551 Pain in right hip: Secondary | ICD-10-CM | POA: Diagnosis not present

## 2018-06-18 DIAGNOSIS — A084 Viral intestinal infection, unspecified: Secondary | ICD-10-CM | POA: Diagnosis not present

## 2018-07-26 DIAGNOSIS — Z79899 Other long term (current) drug therapy: Secondary | ICD-10-CM | POA: Diagnosis not present

## 2018-07-26 DIAGNOSIS — R7989 Other specified abnormal findings of blood chemistry: Secondary | ICD-10-CM | POA: Diagnosis not present

## 2018-07-26 DIAGNOSIS — E782 Mixed hyperlipidemia: Secondary | ICD-10-CM | POA: Diagnosis not present

## 2018-08-02 DIAGNOSIS — Z0000 Encounter for general adult medical examination without abnormal findings: Secondary | ICD-10-CM | POA: Diagnosis not present

## 2018-08-02 DIAGNOSIS — E782 Mixed hyperlipidemia: Secondary | ICD-10-CM | POA: Diagnosis not present

## 2018-08-02 DIAGNOSIS — G20 Parkinson's disease: Secondary | ICD-10-CM | POA: Diagnosis not present

## 2019-02-07 DIAGNOSIS — G20 Parkinson's disease: Secondary | ICD-10-CM | POA: Diagnosis not present

## 2019-06-07 DIAGNOSIS — M48062 Spinal stenosis, lumbar region with neurogenic claudication: Secondary | ICD-10-CM | POA: Diagnosis not present

## 2019-06-07 DIAGNOSIS — Z0000 Encounter for general adult medical examination without abnormal findings: Secondary | ICD-10-CM | POA: Diagnosis not present

## 2019-06-07 DIAGNOSIS — G20 Parkinson's disease: Secondary | ICD-10-CM | POA: Diagnosis not present

## 2019-07-20 DIAGNOSIS — H538 Other visual disturbances: Secondary | ICD-10-CM | POA: Diagnosis not present

## 2019-07-20 DIAGNOSIS — M48062 Spinal stenosis, lumbar region with neurogenic claudication: Secondary | ICD-10-CM | POA: Diagnosis not present

## 2019-08-01 DIAGNOSIS — M545 Low back pain: Secondary | ICD-10-CM | POA: Diagnosis not present

## 2019-08-01 DIAGNOSIS — M48062 Spinal stenosis, lumbar region with neurogenic claudication: Secondary | ICD-10-CM | POA: Diagnosis not present

## 2019-08-01 DIAGNOSIS — M6281 Muscle weakness (generalized): Secondary | ICD-10-CM | POA: Diagnosis not present

## 2019-08-10 ENCOUNTER — Other Ambulatory Visit: Payer: Self-pay | Admitting: Internal Medicine

## 2019-08-10 DIAGNOSIS — M5416 Radiculopathy, lumbar region: Secondary | ICD-10-CM

## 2019-08-29 DIAGNOSIS — E538 Deficiency of other specified B group vitamins: Secondary | ICD-10-CM | POA: Diagnosis not present

## 2019-08-29 DIAGNOSIS — E782 Mixed hyperlipidemia: Secondary | ICD-10-CM | POA: Diagnosis not present

## 2019-09-05 DIAGNOSIS — G20 Parkinson's disease: Secondary | ICD-10-CM | POA: Diagnosis not present

## 2019-09-05 DIAGNOSIS — E782 Mixed hyperlipidemia: Secondary | ICD-10-CM | POA: Diagnosis not present

## 2019-09-05 DIAGNOSIS — Z0000 Encounter for general adult medical examination without abnormal findings: Secondary | ICD-10-CM | POA: Diagnosis not present

## 2019-09-12 DIAGNOSIS — H5203 Hypermetropia, bilateral: Secondary | ICD-10-CM | POA: Diagnosis not present

## 2019-09-12 DIAGNOSIS — H04123 Dry eye syndrome of bilateral lacrimal glands: Secondary | ICD-10-CM | POA: Diagnosis not present

## 2019-09-12 DIAGNOSIS — Z961 Presence of intraocular lens: Secondary | ICD-10-CM | POA: Diagnosis not present

## 2019-09-12 DIAGNOSIS — H52223 Regular astigmatism, bilateral: Secondary | ICD-10-CM | POA: Diagnosis not present

## 2019-09-13 DIAGNOSIS — Z1212 Encounter for screening for malignant neoplasm of rectum: Secondary | ICD-10-CM | POA: Diagnosis not present

## 2019-09-13 DIAGNOSIS — Z1211 Encounter for screening for malignant neoplasm of colon: Secondary | ICD-10-CM | POA: Diagnosis not present

## 2020-01-29 DIAGNOSIS — H52223 Regular astigmatism, bilateral: Secondary | ICD-10-CM | POA: Diagnosis not present

## 2020-01-29 DIAGNOSIS — H5203 Hypermetropia, bilateral: Secondary | ICD-10-CM | POA: Diagnosis not present

## 2020-01-29 DIAGNOSIS — H04123 Dry eye syndrome of bilateral lacrimal glands: Secondary | ICD-10-CM | POA: Diagnosis not present

## 2020-01-29 DIAGNOSIS — Z961 Presence of intraocular lens: Secondary | ICD-10-CM | POA: Diagnosis not present

## 2020-03-05 DIAGNOSIS — M48062 Spinal stenosis, lumbar region with neurogenic claudication: Secondary | ICD-10-CM | POA: Diagnosis not present

## 2020-03-05 DIAGNOSIS — G20 Parkinson's disease: Secondary | ICD-10-CM | POA: Diagnosis not present

## 2020-04-02 ENCOUNTER — Other Ambulatory Visit: Payer: Self-pay | Admitting: Internal Medicine

## 2020-04-02 DIAGNOSIS — M5416 Radiculopathy, lumbar region: Secondary | ICD-10-CM | POA: Diagnosis not present

## 2020-04-02 DIAGNOSIS — M47816 Spondylosis without myelopathy or radiculopathy, lumbar region: Secondary | ICD-10-CM | POA: Diagnosis not present

## 2020-04-02 DIAGNOSIS — M48062 Spinal stenosis, lumbar region with neurogenic claudication: Secondary | ICD-10-CM | POA: Diagnosis not present

## 2020-04-02 DIAGNOSIS — M5440 Lumbago with sciatica, unspecified side: Secondary | ICD-10-CM

## 2020-04-09 DIAGNOSIS — R197 Diarrhea, unspecified: Secondary | ICD-10-CM | POA: Diagnosis not present

## 2020-04-09 DIAGNOSIS — R1032 Left lower quadrant pain: Secondary | ICD-10-CM | POA: Diagnosis not present

## 2020-04-17 ENCOUNTER — Ambulatory Visit: Payer: PPO

## 2020-04-18 ENCOUNTER — Other Ambulatory Visit: Payer: Self-pay

## 2020-04-18 ENCOUNTER — Ambulatory Visit
Admission: RE | Admit: 2020-04-18 | Discharge: 2020-04-18 | Disposition: A | Discharge status: Home / Self Care | Payer: PPO | Source: Ambulatory Visit | Attending: Internal Medicine | Admitting: Internal Medicine

## 2020-04-18 DIAGNOSIS — M5440 Lumbago with sciatica, unspecified side: Secondary | ICD-10-CM | POA: Insufficient documentation

## 2020-04-18 DIAGNOSIS — M545 Low back pain: Secondary | ICD-10-CM | POA: Diagnosis not present

## 2020-04-23 DIAGNOSIS — M5116 Intervertebral disc disorders with radiculopathy, lumbar region: Secondary | ICD-10-CM | POA: Diagnosis not present

## 2020-05-02 DIAGNOSIS — M48062 Spinal stenosis, lumbar region with neurogenic claudication: Secondary | ICD-10-CM | POA: Diagnosis not present

## 2020-09-23 DIAGNOSIS — G20 Parkinson's disease: Secondary | ICD-10-CM | POA: Diagnosis not present

## 2020-09-23 DIAGNOSIS — E782 Mixed hyperlipidemia: Secondary | ICD-10-CM | POA: Diagnosis not present

## 2020-09-24 DIAGNOSIS — Z0000 Encounter for general adult medical examination without abnormal findings: Secondary | ICD-10-CM | POA: Diagnosis not present

## 2020-09-24 DIAGNOSIS — G20 Parkinson's disease: Secondary | ICD-10-CM | POA: Diagnosis not present

## 2020-09-24 DIAGNOSIS — E538 Deficiency of other specified B group vitamins: Secondary | ICD-10-CM | POA: Diagnosis not present

## 2020-09-24 DIAGNOSIS — D500 Iron deficiency anemia secondary to blood loss (chronic): Secondary | ICD-10-CM | POA: Diagnosis not present

## 2020-09-24 DIAGNOSIS — E782 Mixed hyperlipidemia: Secondary | ICD-10-CM | POA: Diagnosis not present

## 2020-10-11 DIAGNOSIS — E538 Deficiency of other specified B group vitamins: Secondary | ICD-10-CM | POA: Diagnosis not present

## 2020-10-11 DIAGNOSIS — D500 Iron deficiency anemia secondary to blood loss (chronic): Secondary | ICD-10-CM | POA: Diagnosis not present

## 2020-11-20 DIAGNOSIS — G20 Parkinson's disease: Secondary | ICD-10-CM | POA: Diagnosis not present

## 2020-11-20 DIAGNOSIS — M5416 Radiculopathy, lumbar region: Secondary | ICD-10-CM | POA: Diagnosis not present

## 2020-11-20 DIAGNOSIS — D500 Iron deficiency anemia secondary to blood loss (chronic): Secondary | ICD-10-CM | POA: Diagnosis not present

## 2020-11-27 DIAGNOSIS — G609 Hereditary and idiopathic neuropathy, unspecified: Secondary | ICD-10-CM | POA: Diagnosis not present

## 2020-11-27 DIAGNOSIS — G20 Parkinson's disease: Secondary | ICD-10-CM | POA: Diagnosis not present

## 2020-12-17 DIAGNOSIS — H16223 Keratoconjunctivitis sicca, not specified as Sjogren's, bilateral: Secondary | ICD-10-CM | POA: Diagnosis not present

## 2021-01-04 IMAGING — MR MR LUMBAR SPINE W/O CM
5 series · 30 of 48 positions shown · non-contrast
Comparison: 12/11/2014

CLINICAL DATA: Worsening low back pain over the last several
months. No radiation to the legs described. Tingling in both hips.

EXAM:
MRI LUMBAR SPINE WITHOUT CONTRAST
TECHNIQUE: Multiplanar, multisequence MR imaging of the lumbar spine was
performed. No intravenous contrast was administered.

[Series 5: T2 · sagittal · 4.0mm · 0.81mm/px · 6 of 17 slices shown (1 of 2)]
[im 1/17]
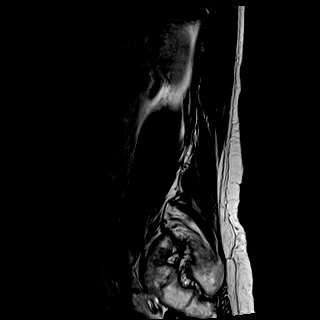
[im 4/17]
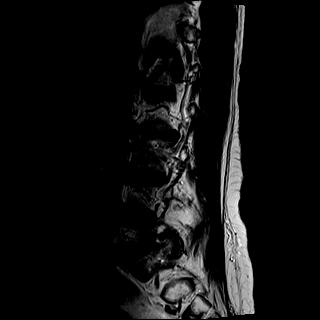
[im 7/17]
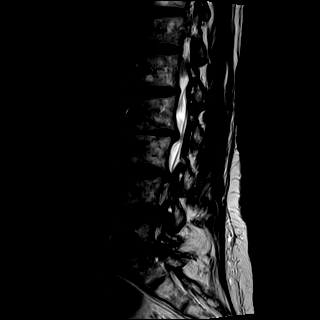
[im 10/17]
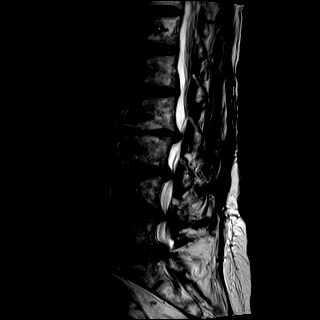
[im 13/17]
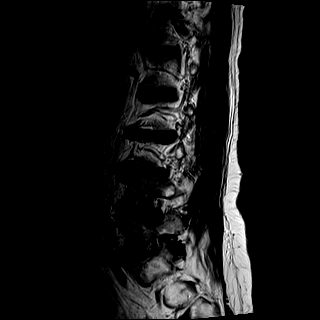
[im 17/17]
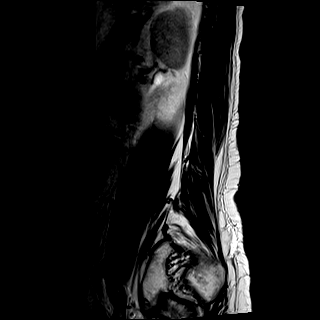

[Series 6: T1 · sagittal · 4.0mm · 0.81mm/px · 7 of 17 slices shown (1 of 2)]
[im 1/17]
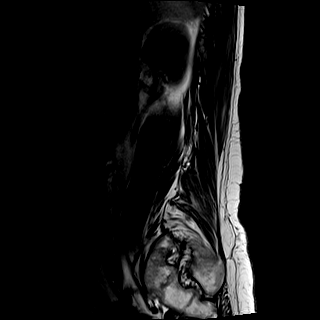
[im 3/17]
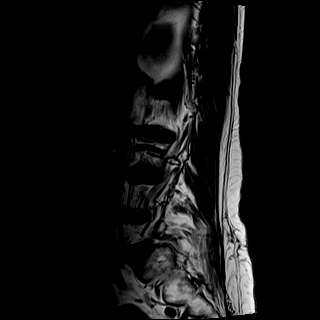
[im 6/17]
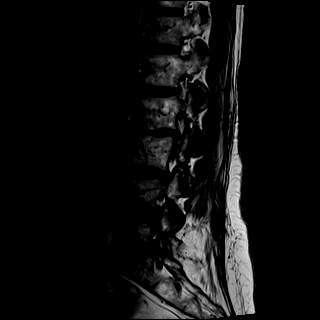
[im 9/17]
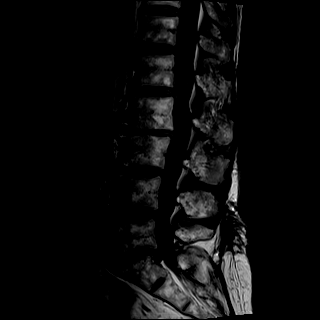
[im 11/17]
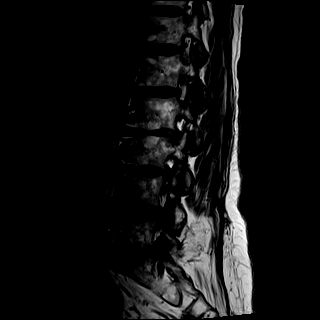
[im 14/17]
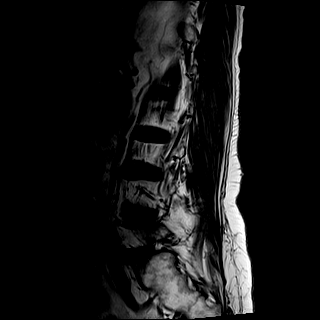
[im 17/17]
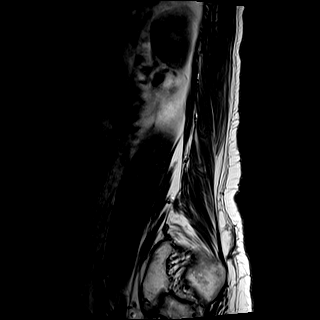

[Series 7: STIR · sagittal · 4.0mm · 0.41mm/px · 1 of 17 slices shown]
[im 1/17]
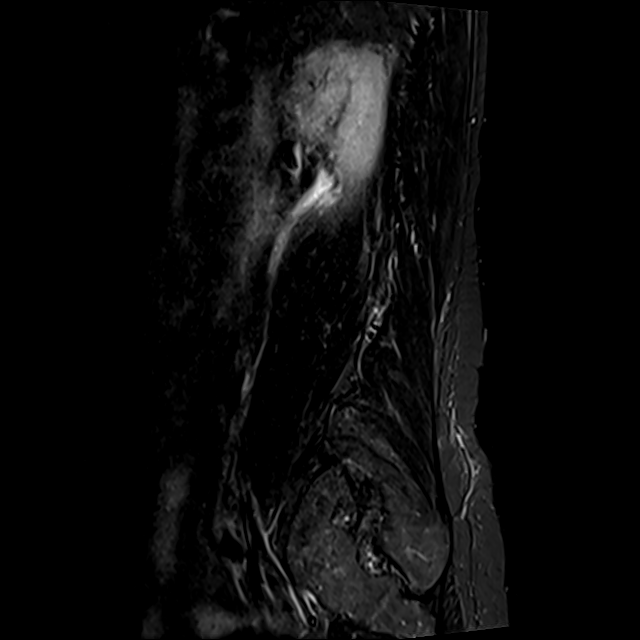

[Series 8: T2 · axial · 4.0mm · 0.78mm/px · z∈[-173,+31]mm · 8 of 36 slices shown (2 of 2)]
[im 1/36]
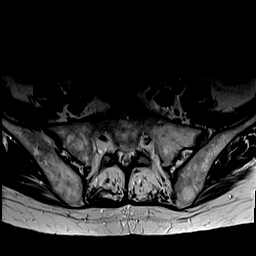
[im 6/36]
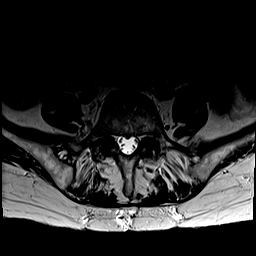
[im 11/36]
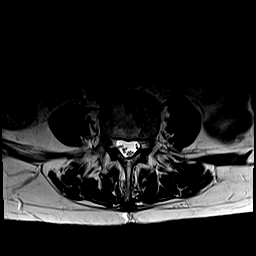
[im 17/36]
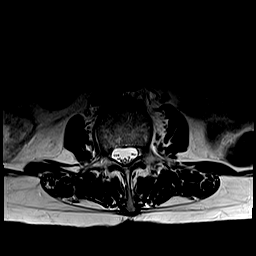
[im 19/36]
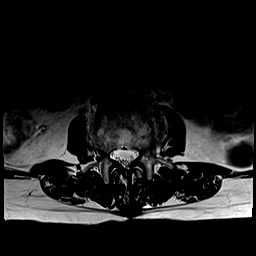
[im 25/36]
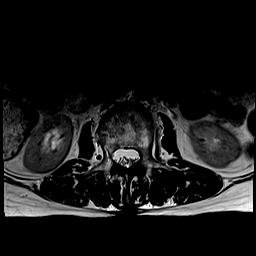
[im 30/36]
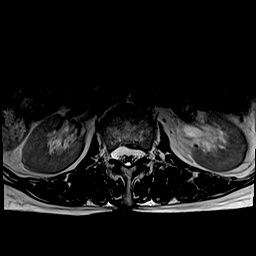
[im 36/36]
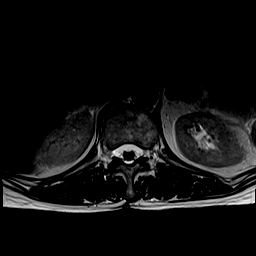

[Series 9: T1 · axial · 4.0mm · 0.39mm/px · z∈[-173,+31]mm · 8 of 36 slices shown (2 of 2)]
[im 1/36]
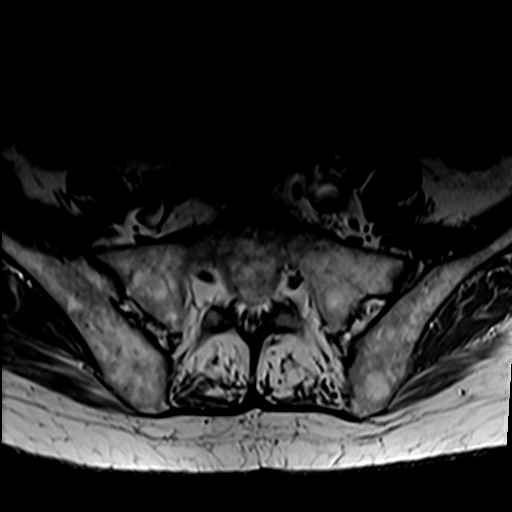
[im 6/36]
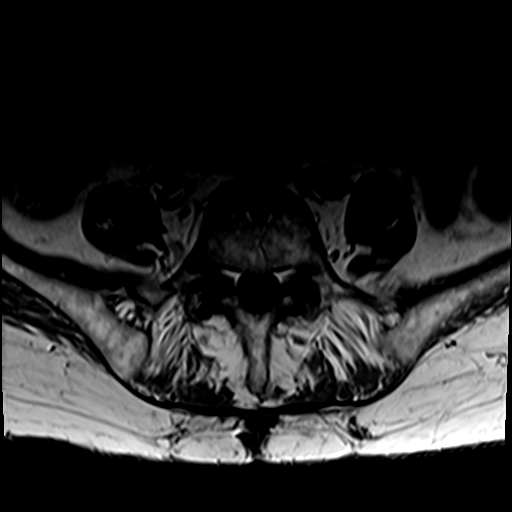
[im 11/36]
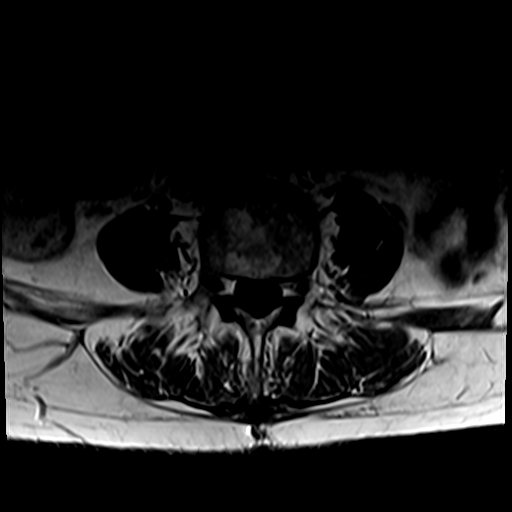
[im 17/36]
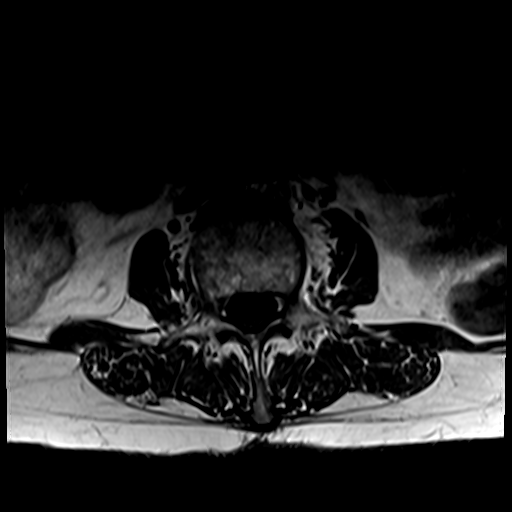
[im 19/36]
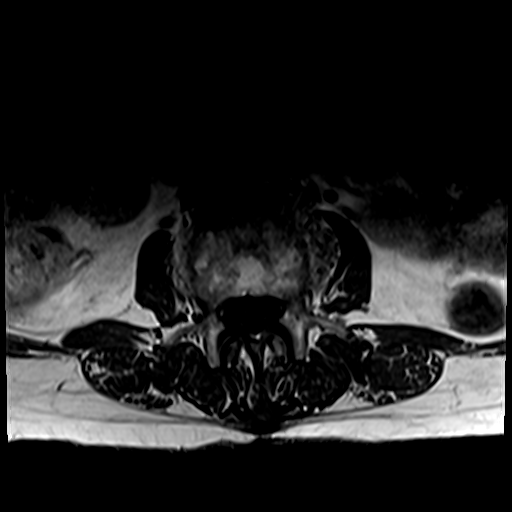
[im 25/36]
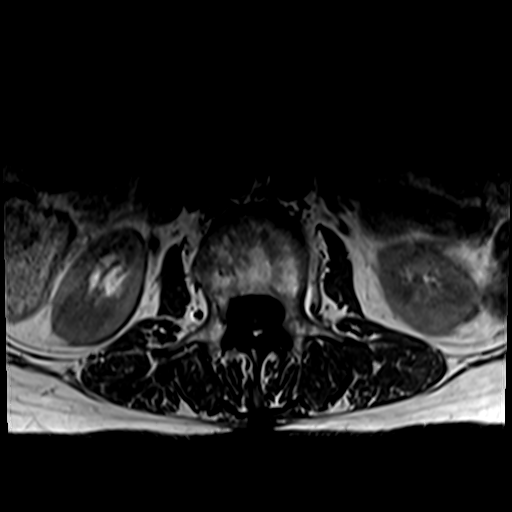
[im 30/36]
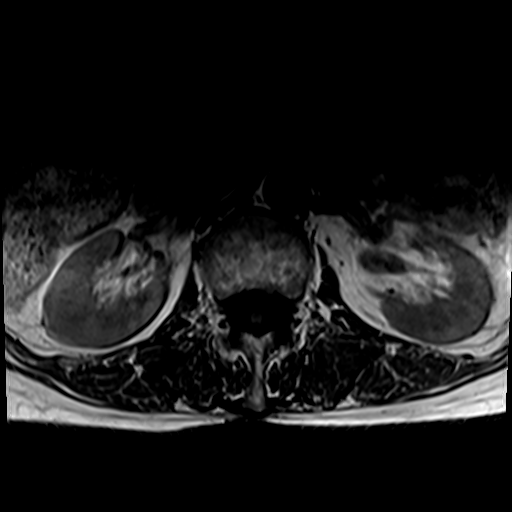
[im 36/36]
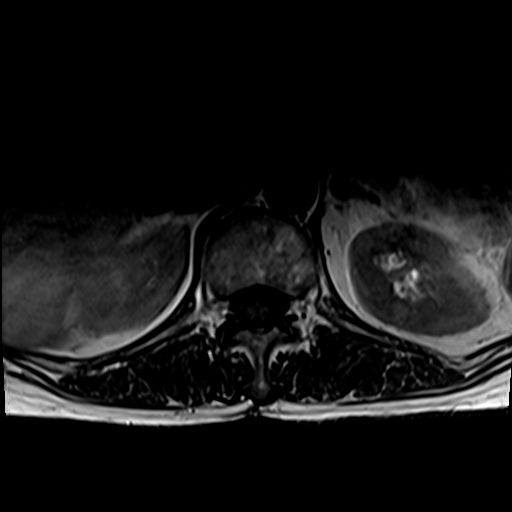

[30 of 48 positions shown; findings below may reference images not displayed]

FINDINGS: Segmentation:  5 lumbar type vertebral bodies.

Alignment:  Straightening of the normal lumbar lordosis.

Vertebrae: No fracture or primary bone lesion. Some discogenic
endplate edematous changes at L2-3 and L5-S1 could contribute to
back pain.

Conus medullaris and cauda equina: Conus extends to the L1 level.
Conus and cauda equina appear normal.

Paraspinal and other soft tissues: Negative

Disc levels:

T12-L1: Normal.

L1-2: Disc degeneration with centrally predominant disc protrusion.
Mild indentation of the thecal sac but no compressive stenosis.

L2-3: Disc degeneration with endplate osteophytes and annular
bulging more prominent towards the left. Stenosis of both lateral
recesses left more than right. Neural compression could occur on the
left in particular. Similar appearance to the study of 9821.

L3-4: Protrusion of the disc more prominent in the left
posterolateral direction. Stenosis of both lateral recesses left
more than right. Neural compression could occur at this level on
either side. Findings appear similar to the study of 9821.

L4-5: Bulging of the disc. Bilateral facet and ligamentous
hypertrophy. Stenosis of both lateral recesses that could cause
neural compression on either or both sides. Findings have worsened
since 9821.

L5-S1: Previous posterior decompression. Broad-based protrusion of
the disc. Stenosis of both subarticular lateral recesses and neural
foramina. Both L5 and S1 nerves could be compressed. The disc
protrusion is more prominent than seen in 9821, with particular
worsening of the potential for S1 nerve compression.
IMPRESSION: L5-S1: Previous posterior decompression. Worsening of a broad-based
disc protrusion with stenosis of the subarticular lateral recesses
that would seem likely compress the S1 nerves. Chronic bilateral
foraminal stenosis that could affect the exiting L5 nerves.

L4-5: Slight worsening of a disc bulge. Facet and ligamentous
hypertrophy. Worsening of bilateral lateral recess stenosis that
could compress either or both L5 nerves.

L2-3 and L3-4: Similar appearance of disc degeneration and
protrusions with narrowing of the lateral recesses left more than
right that could cause neural compression at either or both levels,
seemingly more likely on the left.

L1-2: Small disc protrusion without apparent compressive stenosis.

## 2021-02-27 DIAGNOSIS — H04123 Dry eye syndrome of bilateral lacrimal glands: Secondary | ICD-10-CM | POA: Diagnosis not present

## 2021-02-27 DIAGNOSIS — Z961 Presence of intraocular lens: Secondary | ICD-10-CM | POA: Diagnosis not present

## 2021-03-04 DIAGNOSIS — M5441 Lumbago with sciatica, right side: Secondary | ICD-10-CM | POA: Diagnosis not present

## 2021-03-04 DIAGNOSIS — M48062 Spinal stenosis, lumbar region with neurogenic claudication: Secondary | ICD-10-CM | POA: Diagnosis not present

## 2021-03-04 DIAGNOSIS — G8929 Other chronic pain: Secondary | ICD-10-CM | POA: Diagnosis not present

## 2021-03-04 DIAGNOSIS — M5442 Lumbago with sciatica, left side: Secondary | ICD-10-CM | POA: Diagnosis not present

## 2021-03-11 DIAGNOSIS — H04123 Dry eye syndrome of bilateral lacrimal glands: Secondary | ICD-10-CM | POA: Diagnosis not present

## 2021-03-11 DIAGNOSIS — Z961 Presence of intraocular lens: Secondary | ICD-10-CM | POA: Diagnosis not present

## 2021-03-13 DIAGNOSIS — M5442 Lumbago with sciatica, left side: Secondary | ICD-10-CM | POA: Diagnosis not present

## 2021-03-13 DIAGNOSIS — M48062 Spinal stenosis, lumbar region with neurogenic claudication: Secondary | ICD-10-CM | POA: Diagnosis not present

## 2021-03-13 DIAGNOSIS — M5441 Lumbago with sciatica, right side: Secondary | ICD-10-CM | POA: Diagnosis not present

## 2021-03-13 DIAGNOSIS — G8929 Other chronic pain: Secondary | ICD-10-CM | POA: Diagnosis not present

## 2021-03-18 DIAGNOSIS — E782 Mixed hyperlipidemia: Secondary | ICD-10-CM | POA: Diagnosis not present

## 2021-03-25 DIAGNOSIS — E782 Mixed hyperlipidemia: Secondary | ICD-10-CM | POA: Diagnosis not present

## 2021-03-25 DIAGNOSIS — Z0000 Encounter for general adult medical examination without abnormal findings: Secondary | ICD-10-CM | POA: Diagnosis not present

## 2021-03-25 DIAGNOSIS — G20 Parkinson's disease: Secondary | ICD-10-CM | POA: Diagnosis not present

## 2021-03-25 DIAGNOSIS — R739 Hyperglycemia, unspecified: Secondary | ICD-10-CM | POA: Diagnosis not present

## 2021-04-01 DIAGNOSIS — M48062 Spinal stenosis, lumbar region with neurogenic claudication: Secondary | ICD-10-CM | POA: Diagnosis not present

## 2021-04-01 DIAGNOSIS — M5441 Lumbago with sciatica, right side: Secondary | ICD-10-CM | POA: Diagnosis not present

## 2021-04-01 DIAGNOSIS — R300 Dysuria: Secondary | ICD-10-CM | POA: Diagnosis not present

## 2021-04-01 DIAGNOSIS — M5442 Lumbago with sciatica, left side: Secondary | ICD-10-CM | POA: Diagnosis not present

## 2021-04-01 DIAGNOSIS — G8929 Other chronic pain: Secondary | ICD-10-CM | POA: Diagnosis not present

## 2021-04-03 DIAGNOSIS — G609 Hereditary and idiopathic neuropathy, unspecified: Secondary | ICD-10-CM | POA: Diagnosis not present

## 2021-04-03 DIAGNOSIS — G20 Parkinson's disease: Secondary | ICD-10-CM | POA: Diagnosis not present

## 2021-04-09 DIAGNOSIS — Z961 Presence of intraocular lens: Secondary | ICD-10-CM | POA: Diagnosis not present

## 2021-04-09 DIAGNOSIS — H04123 Dry eye syndrome of bilateral lacrimal glands: Secondary | ICD-10-CM | POA: Diagnosis not present

## 2021-05-08 DIAGNOSIS — Z961 Presence of intraocular lens: Secondary | ICD-10-CM | POA: Diagnosis not present

## 2021-05-08 DIAGNOSIS — H04123 Dry eye syndrome of bilateral lacrimal glands: Secondary | ICD-10-CM | POA: Diagnosis not present

## 2021-05-15 DIAGNOSIS — M5116 Intervertebral disc disorders with radiculopathy, lumbar region: Secondary | ICD-10-CM | POA: Diagnosis not present

## 2021-06-04 DIAGNOSIS — Z87891 Personal history of nicotine dependence: Secondary | ICD-10-CM | POA: Diagnosis not present

## 2021-06-04 DIAGNOSIS — M5136 Other intervertebral disc degeneration, lumbar region: Secondary | ICD-10-CM | POA: Diagnosis not present

## 2021-06-04 DIAGNOSIS — E785 Hyperlipidemia, unspecified: Secondary | ICD-10-CM | POA: Diagnosis not present

## 2021-06-04 DIAGNOSIS — F419 Anxiety disorder, unspecified: Secondary | ICD-10-CM | POA: Diagnosis not present

## 2021-06-04 DIAGNOSIS — G20 Parkinson's disease: Secondary | ICD-10-CM | POA: Diagnosis not present

## 2021-06-04 DIAGNOSIS — I10 Essential (primary) hypertension: Secondary | ICD-10-CM | POA: Diagnosis not present

## 2021-06-04 DIAGNOSIS — G629 Polyneuropathy, unspecified: Secondary | ICD-10-CM | POA: Diagnosis not present

## 2021-06-04 DIAGNOSIS — K219 Gastro-esophageal reflux disease without esophagitis: Secondary | ICD-10-CM | POA: Diagnosis not present

## 2021-06-12 DIAGNOSIS — G20 Parkinson's disease: Secondary | ICD-10-CM | POA: Diagnosis not present

## 2021-06-12 DIAGNOSIS — F419 Anxiety disorder, unspecified: Secondary | ICD-10-CM | POA: Diagnosis not present

## 2021-06-12 DIAGNOSIS — M5136 Other intervertebral disc degeneration, lumbar region: Secondary | ICD-10-CM | POA: Diagnosis not present

## 2021-06-12 DIAGNOSIS — G629 Polyneuropathy, unspecified: Secondary | ICD-10-CM | POA: Diagnosis not present

## 2021-06-12 DIAGNOSIS — K219 Gastro-esophageal reflux disease without esophagitis: Secondary | ICD-10-CM | POA: Diagnosis not present

## 2021-06-12 DIAGNOSIS — E785 Hyperlipidemia, unspecified: Secondary | ICD-10-CM | POA: Diagnosis not present

## 2021-06-12 DIAGNOSIS — I10 Essential (primary) hypertension: Secondary | ICD-10-CM | POA: Diagnosis not present

## 2021-06-18 DIAGNOSIS — M5136 Other intervertebral disc degeneration, lumbar region: Secondary | ICD-10-CM | POA: Diagnosis not present

## 2021-06-18 DIAGNOSIS — K219 Gastro-esophageal reflux disease without esophagitis: Secondary | ICD-10-CM | POA: Diagnosis not present

## 2021-06-18 DIAGNOSIS — I10 Essential (primary) hypertension: Secondary | ICD-10-CM | POA: Diagnosis not present

## 2021-06-18 DIAGNOSIS — F419 Anxiety disorder, unspecified: Secondary | ICD-10-CM | POA: Diagnosis not present

## 2021-06-18 DIAGNOSIS — G629 Polyneuropathy, unspecified: Secondary | ICD-10-CM | POA: Diagnosis not present

## 2021-06-18 DIAGNOSIS — G20 Parkinson's disease: Secondary | ICD-10-CM | POA: Diagnosis not present

## 2021-06-18 DIAGNOSIS — Z87891 Personal history of nicotine dependence: Secondary | ICD-10-CM | POA: Diagnosis not present

## 2021-06-18 DIAGNOSIS — E785 Hyperlipidemia, unspecified: Secondary | ICD-10-CM | POA: Diagnosis not present

## 2021-09-03 DIAGNOSIS — G20 Parkinson's disease: Secondary | ICD-10-CM | POA: Diagnosis not present

## 2021-09-03 DIAGNOSIS — G609 Hereditary and idiopathic neuropathy, unspecified: Secondary | ICD-10-CM | POA: Diagnosis not present

## 2021-09-09 DIAGNOSIS — Z87891 Personal history of nicotine dependence: Secondary | ICD-10-CM | POA: Diagnosis not present

## 2021-09-09 DIAGNOSIS — E785 Hyperlipidemia, unspecified: Secondary | ICD-10-CM | POA: Diagnosis not present

## 2021-09-09 DIAGNOSIS — K5900 Constipation, unspecified: Secondary | ICD-10-CM | POA: Diagnosis not present

## 2021-09-09 DIAGNOSIS — G609 Hereditary and idiopathic neuropathy, unspecified: Secondary | ICD-10-CM | POA: Diagnosis not present

## 2021-09-09 DIAGNOSIS — R251 Tremor, unspecified: Secondary | ICD-10-CM | POA: Diagnosis not present

## 2021-09-09 DIAGNOSIS — Z9181 History of falling: Secondary | ICD-10-CM | POA: Diagnosis not present

## 2021-09-09 DIAGNOSIS — F419 Anxiety disorder, unspecified: Secondary | ICD-10-CM | POA: Diagnosis not present

## 2021-09-09 DIAGNOSIS — Z79899 Other long term (current) drug therapy: Secondary | ICD-10-CM | POA: Diagnosis not present

## 2021-09-09 DIAGNOSIS — M5136 Other intervertebral disc degeneration, lumbar region: Secondary | ICD-10-CM | POA: Diagnosis not present

## 2021-09-09 DIAGNOSIS — G20 Parkinson's disease: Secondary | ICD-10-CM | POA: Diagnosis not present

## 2021-09-09 DIAGNOSIS — K219 Gastro-esophageal reflux disease without esophagitis: Secondary | ICD-10-CM | POA: Diagnosis not present

## 2021-09-17 DIAGNOSIS — E785 Hyperlipidemia, unspecified: Secondary | ICD-10-CM | POA: Diagnosis not present

## 2021-09-17 DIAGNOSIS — K219 Gastro-esophageal reflux disease without esophagitis: Secondary | ICD-10-CM | POA: Diagnosis not present

## 2021-09-17 DIAGNOSIS — M5136 Other intervertebral disc degeneration, lumbar region: Secondary | ICD-10-CM | POA: Diagnosis not present

## 2021-09-17 DIAGNOSIS — Z79899 Other long term (current) drug therapy: Secondary | ICD-10-CM | POA: Diagnosis not present

## 2021-09-17 DIAGNOSIS — G609 Hereditary and idiopathic neuropathy, unspecified: Secondary | ICD-10-CM | POA: Diagnosis not present

## 2021-09-17 DIAGNOSIS — R251 Tremor, unspecified: Secondary | ICD-10-CM | POA: Diagnosis not present

## 2021-09-17 DIAGNOSIS — K5900 Constipation, unspecified: Secondary | ICD-10-CM | POA: Diagnosis not present

## 2021-09-17 DIAGNOSIS — Z9181 History of falling: Secondary | ICD-10-CM | POA: Diagnosis not present

## 2021-09-17 DIAGNOSIS — F419 Anxiety disorder, unspecified: Secondary | ICD-10-CM | POA: Diagnosis not present

## 2021-09-17 DIAGNOSIS — Z87891 Personal history of nicotine dependence: Secondary | ICD-10-CM | POA: Diagnosis not present

## 2021-09-17 DIAGNOSIS — G20 Parkinson's disease: Secondary | ICD-10-CM | POA: Diagnosis not present

## 2021-09-23 DIAGNOSIS — K219 Gastro-esophageal reflux disease without esophagitis: Secondary | ICD-10-CM | POA: Diagnosis not present

## 2021-09-23 DIAGNOSIS — G20 Parkinson's disease: Secondary | ICD-10-CM | POA: Diagnosis not present

## 2021-09-23 DIAGNOSIS — F419 Anxiety disorder, unspecified: Secondary | ICD-10-CM | POA: Diagnosis not present

## 2021-09-23 DIAGNOSIS — Z9181 History of falling: Secondary | ICD-10-CM | POA: Diagnosis not present

## 2021-09-23 DIAGNOSIS — G609 Hereditary and idiopathic neuropathy, unspecified: Secondary | ICD-10-CM | POA: Diagnosis not present

## 2021-09-23 DIAGNOSIS — R251 Tremor, unspecified: Secondary | ICD-10-CM | POA: Diagnosis not present

## 2021-09-23 DIAGNOSIS — E785 Hyperlipidemia, unspecified: Secondary | ICD-10-CM | POA: Diagnosis not present

## 2021-09-23 DIAGNOSIS — M5136 Other intervertebral disc degeneration, lumbar region: Secondary | ICD-10-CM | POA: Diagnosis not present

## 2021-09-23 DIAGNOSIS — Z79899 Other long term (current) drug therapy: Secondary | ICD-10-CM | POA: Diagnosis not present

## 2021-09-23 DIAGNOSIS — Z87891 Personal history of nicotine dependence: Secondary | ICD-10-CM | POA: Diagnosis not present

## 2021-09-23 DIAGNOSIS — K5900 Constipation, unspecified: Secondary | ICD-10-CM | POA: Diagnosis not present

## 2021-09-24 DIAGNOSIS — F419 Anxiety disorder, unspecified: Secondary | ICD-10-CM | POA: Diagnosis not present

## 2021-09-24 DIAGNOSIS — G20 Parkinson's disease: Secondary | ICD-10-CM | POA: Diagnosis not present

## 2021-09-24 DIAGNOSIS — R251 Tremor, unspecified: Secondary | ICD-10-CM | POA: Diagnosis not present

## 2021-09-24 DIAGNOSIS — K5900 Constipation, unspecified: Secondary | ICD-10-CM | POA: Diagnosis not present

## 2021-09-24 DIAGNOSIS — Z87891 Personal history of nicotine dependence: Secondary | ICD-10-CM | POA: Diagnosis not present

## 2021-09-24 DIAGNOSIS — M5136 Other intervertebral disc degeneration, lumbar region: Secondary | ICD-10-CM | POA: Diagnosis not present

## 2021-09-24 DIAGNOSIS — Z9181 History of falling: Secondary | ICD-10-CM | POA: Diagnosis not present

## 2021-09-24 DIAGNOSIS — E785 Hyperlipidemia, unspecified: Secondary | ICD-10-CM | POA: Diagnosis not present

## 2021-09-24 DIAGNOSIS — G609 Hereditary and idiopathic neuropathy, unspecified: Secondary | ICD-10-CM | POA: Diagnosis not present

## 2021-09-24 DIAGNOSIS — K219 Gastro-esophageal reflux disease without esophagitis: Secondary | ICD-10-CM | POA: Diagnosis not present

## 2021-09-24 DIAGNOSIS — Z79899 Other long term (current) drug therapy: Secondary | ICD-10-CM | POA: Diagnosis not present

## 2021-10-10 ENCOUNTER — Emergency Department: Payer: PPO

## 2021-10-10 ENCOUNTER — Other Ambulatory Visit: Payer: Self-pay

## 2021-10-10 ENCOUNTER — Emergency Department
Admission: EM | Admit: 2021-10-10 | Discharge: 2021-10-11 | Disposition: A | Discharge status: Home / Self Care | Payer: PPO | Attending: Emergency Medicine | Admitting: Emergency Medicine

## 2021-10-10 DIAGNOSIS — Z87891 Personal history of nicotine dependence: Secondary | ICD-10-CM | POA: Insufficient documentation

## 2021-10-10 DIAGNOSIS — R9431 Abnormal electrocardiogram [ECG] [EKG]: Secondary | ICD-10-CM | POA: Diagnosis not present

## 2021-10-10 DIAGNOSIS — R531 Weakness: Secondary | ICD-10-CM | POA: Diagnosis not present

## 2021-10-10 DIAGNOSIS — G20 Parkinson's disease: Secondary | ICD-10-CM | POA: Diagnosis not present

## 2021-10-10 DIAGNOSIS — M545 Low back pain, unspecified: Secondary | ICD-10-CM | POA: Diagnosis not present

## 2021-10-10 LAB — URINALYSIS, COMPLETE (UACMP) WITH MICROSCOPIC
Bacteria, UA: NONE SEEN
Bilirubin Urine: NEGATIVE
Glucose, UA: NEGATIVE mg/dL
Hgb urine dipstick: NEGATIVE
Ketones, ur: 5 mg/dL — AB
Leukocytes,Ua: NEGATIVE
Nitrite: NEGATIVE
Protein, ur: NEGATIVE mg/dL
Specific Gravity, Urine: 1.019 (ref 1.005–1.030)
pH: 5 (ref 5.0–8.0)

## 2021-10-10 LAB — BASIC METABOLIC PANEL
Anion gap: 6 (ref 5–15)
BUN: 21 mg/dL (ref 8–23)
CO2: 27 mmol/L (ref 22–32)
Calcium: 9.4 mg/dL (ref 8.9–10.3)
Chloride: 103 mmol/L (ref 98–111)
Creatinine, Ser: 1.15 mg/dL — ABNORMAL HIGH (ref 0.44–1.00)
GFR, Estimated: 49 mL/min — ABNORMAL LOW (ref >60)
Glucose, Bld: 114 mg/dL — ABNORMAL HIGH (ref 70–99)
Potassium: 4.4 mmol/L (ref 3.5–5.1)
Sodium: 136 mmol/L (ref 135–145)

## 2021-10-10 LAB — CBC
HCT: 39.2 % (ref 36.0–46.0)
Hemoglobin: 12.9 g/dL (ref 12.0–15.0)
MCH: 34.2 pg — ABNORMAL HIGH (ref 26.0–34.0)
MCHC: 32.9 g/dL (ref 30.0–36.0)
MCV: 104 fL — ABNORMAL HIGH (ref 80.0–100.0)
Platelets: 280 10*3/uL (ref 150–400)
RBC: 3.77 MIL/uL — ABNORMAL LOW (ref 3.87–5.11)
RDW: 13.2 % (ref 11.5–15.5)
WBC: 7.2 10*3/uL (ref 4.0–10.5)
nRBC: 0 % (ref 0.0–0.2)

## 2021-10-10 LAB — PROTIME-INR
INR: 1 (ref 0.8–1.2)
Prothrombin Time: 13.4 seconds (ref 11.4–15.2)

## 2021-10-10 LAB — TROPONIN I (HIGH SENSITIVITY): Troponin I (High Sensitivity): 10 ng/L (ref <18)

## 2021-10-10 NOTE — ED Provider Notes (Signed)
Emergency Medicine Provider Triage Evaluation Note  Monica Watts , a 75 y.o. female  was evaluated in triage.  Pt complains of weakness, mental status changes over the last 2 to 3 days has been increasing.  Husband states patient has been making some changes to Parkinson's medications.  Uncertain which medications, states patient has been making these changes.  Patient denies any recent falls trauma or injury.  She complains of a left-sided weakness, low back pain.  Review of Systems  Positive: Weakness, fatigue, low back pain Negative: Chest pain, shortness of breath, headache, fevers  Physical Exam  BP (!) 151/86   Pulse 98   Temp 98.5 F (36.9 C) (Oral)   Resp 18   Wt 63 kg   SpO2 97%  Gen:   Awake, no distress   Resp:  Normal effort  MSK:   Moves extremities without difficulty  Other:    Medical Decision Making  Medically screening exam initiated at 7:53 PM.  Appropriate orders placed.  Monica Watts was informed that the remainder of the evaluation will be completed by another provider, this initial triage assessment does not replace that evaluation, and the importance of remaining in the ED until their evaluation is complete.     Ronnette Juniper 10/10/21 Dorene Sorrow, MD 10/10/21 2013

## 2021-10-10 NOTE — ED Triage Notes (Signed)
Pt has Hx of parkinson's Dx. Medications were changed 1 week ago. Generalized weakness that has worsened over the past 2 days . Difficulty walking, eating and voiding noted by husband. No focal weakness of confusion noted in assessment.

## 2021-10-11 LAB — TROPONIN I (HIGH SENSITIVITY): Troponin I (High Sensitivity): 10 ng/L (ref <18)

## 2021-10-11 NOTE — Discharge Instructions (Signed)
Please seek medical attention for any high fevers, chest pain, shortness of breath, change in behavior, persistent vomiting, bloody stool or any other new or concerning symptoms.  

## 2021-10-11 NOTE — ED Provider Notes (Signed)
[facility] Emergency Department Provider Note  ____________________________________________   I have reviewed the triage vital signs and the nursing notes.   HISTORY  Chief Complaint Weakness   History limited by: Not Limited   HPI Monica Watts is a 75 y.o. female who presents to the emergency department today because of concern for generalized weakness. The patient has been getting more weak over the past 2-3 days. Has history of parkisons. They have been changing her medications recently. The patient denies any focal weakness. Denies any urinary symptoms. When she talked to her PCPs office today they were concerned she might have had a stroke and recommended ER evaluation.   Records reviewed. Per medical record review patient has a history of parkinsons.    There are no problems to display for this patient.   No past surgical history on file.  Prior to Admission medications   Not on File    Allergies Patient has no allergy information on record.  Family History  Problem Relation Age of Onset   Breast cancer Neg Hx     Social History Social History   Tobacco Use   Smoking status: Former    Review of Systems Constitutional: No fever/chills Eyes: No visual changes. ENT: No sore throat. Cardiovascular: Denies chest pain. Respiratory: Denies shortness of breath. Gastrointestinal: No abdominal pain.  No nausea, no vomiting.  No diarrhea.   Genitourinary: Negative for dysuria. Musculoskeletal: Negative for back pain. Skin: Negative for rash. Neurological: Positive for generalized weakness.   ____________________________________________   PHYSICAL EXAM:  VITAL SIGNS: ED Triage Vitals  Enc Vitals Group     BP 10/10/21 1939 (!) 151/86     Pulse Rate 10/10/21 1939 98     Resp 10/10/21 1939 18     Temp 10/10/21 1939 98.5 F (36.9 C)     Temp Source 10/10/21 1939 Oral     SpO2 10/10/21 1939 97 %     Weight 10/10/21 1943 139 lb  (63 kg)     Height --      Head Circumference --      Peak Flow --      Pain Score 10/10/21 1943 5   Constitutional: Alert and oriented.  Eyes: Conjunctivae are normal.  ENT      Head: Normocephalic and atraumatic.      Nose: No congestion/rhinnorhea.      Mouth/Throat: Mucous membranes are moist.      Neck: No stridor. Hematological/Lymphatic/Immunilogical: No cervical lymphadenopathy. Cardiovascular: Normal rate, regular rhythm.  No murmurs, rubs, or gallops.  Respiratory: Normal respiratory effort without tachypnea nor retractions. Breath sounds are clear and equal bilaterally. No wheezes/rales/rhonchi. Gastrointestinal: Soft and non tender. No rebound. No guarding.  Genitourinary: Deferred Musculoskeletal: Normal range of motion in all extremities. No lower extremity edema. Neurologic:  No focal weakness. Some tremors.  Skin:  Skin is warm, dry and intact. No rash noted. Psychiatric: Mood and affect are normal. Speech and behavior are normal. Patient exhibits appropriate insight and judgment.  ____________________________________________    LABS (pertinent positives/negatives)  Trop hs 10 BMP wnl except glu 114, cr 1.15 CBC wbc 7.2, hgb 12.9, plt 280 UA hazy, ketones 5, rbc and wbc 0-5  ____________________________________________   EKG  I, Phineas Semen, attending physician, personally viewed and interpreted this EKG  EKG Time: 2015 Rate: 97 Rhythm: normal sinus rhythm Axis: right axis deviation Intervals: qtc 502 QRS: RBBB ST changes: no st elevation Impression: abnormal ekg  ____________________________________________  RADIOLOGY  CT head No acute abnormality  ____________________________________________   PROCEDURES  Procedures  ____________________________________________   INITIAL IMPRESSION / ASSESSMENT AND PLAN / ED COURSE  Pertinent labs & imaging results that were available during my care of the patient were reviewed by me and  considered in my medical decision making (see chart for details).   Patient presented to the emergency department because of concern for generalized weakness and PCP concern for possible stroke. On exam patient without focal neuro deficits. Symptoms of parkinsons negative. Work up performed from triage without concerning abnormality. Did however discuss with patient that given concern for possible stroke by PCP MRI would be better test. However patient declined at this time, stated she would like to be discharged home. I think this is reasonable given lack of focal neuro deficit. Discussed importance of neurology follow up.  ____________________________________________   FINAL CLINICAL IMPRESSION(S) / ED DIAGNOSES  Final diagnoses:  Generalized weakness  Parkinson's disease (HCC)     Note: This dictation was prepared with Dragon dictation. Any transcriptional errors that result from this process are unintentional     Phineas Semen, MD 10/11/21 [phone]

## 2021-10-14 DIAGNOSIS — G20 Parkinson's disease: Secondary | ICD-10-CM | POA: Diagnosis not present

## 2021-10-14 DIAGNOSIS — E782 Mixed hyperlipidemia: Secondary | ICD-10-CM | POA: Diagnosis not present

## 2021-10-14 DIAGNOSIS — Z0000 Encounter for general adult medical examination without abnormal findings: Secondary | ICD-10-CM | POA: Diagnosis not present

## 2021-10-14 DIAGNOSIS — R5383 Other fatigue: Secondary | ICD-10-CM | POA: Diagnosis not present

## 2021-10-14 DIAGNOSIS — R739 Hyperglycemia, unspecified: Secondary | ICD-10-CM | POA: Diagnosis not present

## 2021-10-15 DIAGNOSIS — K219 Gastro-esophageal reflux disease without esophagitis: Secondary | ICD-10-CM | POA: Diagnosis not present

## 2021-10-15 DIAGNOSIS — Z87891 Personal history of nicotine dependence: Secondary | ICD-10-CM | POA: Diagnosis not present

## 2021-10-15 DIAGNOSIS — M5136 Other intervertebral disc degeneration, lumbar region: Secondary | ICD-10-CM | POA: Diagnosis not present

## 2021-10-15 DIAGNOSIS — R251 Tremor, unspecified: Secondary | ICD-10-CM | POA: Diagnosis not present

## 2021-10-15 DIAGNOSIS — Z9181 History of falling: Secondary | ICD-10-CM | POA: Diagnosis not present

## 2021-10-15 DIAGNOSIS — E785 Hyperlipidemia, unspecified: Secondary | ICD-10-CM | POA: Diagnosis not present

## 2021-10-15 DIAGNOSIS — F419 Anxiety disorder, unspecified: Secondary | ICD-10-CM | POA: Diagnosis not present

## 2021-10-15 DIAGNOSIS — Z79899 Other long term (current) drug therapy: Secondary | ICD-10-CM | POA: Diagnosis not present

## 2021-10-15 DIAGNOSIS — K5900 Constipation, unspecified: Secondary | ICD-10-CM | POA: Diagnosis not present

## 2021-10-15 DIAGNOSIS — G609 Hereditary and idiopathic neuropathy, unspecified: Secondary | ICD-10-CM | POA: Diagnosis not present

## 2021-10-15 DIAGNOSIS — G20 Parkinson's disease: Secondary | ICD-10-CM | POA: Diagnosis not present

## 2023-10-29 ENCOUNTER — Encounter: Payer: Self-pay | Admitting: Emergency Medicine

## 2023-10-29 ENCOUNTER — Emergency Department: Payer: Medicare Other

## 2023-10-29 ENCOUNTER — Emergency Department
Admission: EM | Admit: 2023-10-29 | Discharge: 2023-10-29 | Disposition: A | Discharge status: Home / Self Care | Payer: Medicare Other | Attending: Emergency Medicine | Admitting: Emergency Medicine

## 2023-10-29 ENCOUNTER — Other Ambulatory Visit: Payer: Self-pay

## 2023-10-29 DIAGNOSIS — G20B2 Parkinson's disease with dyskinesia, with fluctuations: Secondary | ICD-10-CM | POA: Diagnosis not present

## 2023-10-29 DIAGNOSIS — Z20822 Contact with and (suspected) exposure to covid-19: Secondary | ICD-10-CM | POA: Diagnosis not present

## 2023-10-29 DIAGNOSIS — R627 Adult failure to thrive: Secondary | ICD-10-CM | POA: Diagnosis not present

## 2023-10-29 DIAGNOSIS — R531 Weakness: Secondary | ICD-10-CM | POA: Insufficient documentation

## 2023-10-29 HISTORY — DX: Parkinson's disease without dyskinesia, without mention of fluctuations: G20.A1

## 2023-10-29 LAB — CBC WITH DIFFERENTIAL/PLATELET
Abs Immature Granulocytes: 0.03 10*3/uL (ref 0.00–0.07)
Basophils Absolute: 0.1 10*3/uL (ref 0.0–0.1)
Basophils Relative: 1 %
Eosinophils Absolute: 0 10*3/uL (ref 0.0–0.5)
Eosinophils Relative: 0 %
HCT: 36.9 % (ref 36.0–46.0)
Hemoglobin: 12.1 g/dL (ref 12.0–15.0)
Immature Granulocytes: 0 %
Lymphocytes Relative: 10 %
Lymphs Abs: 0.9 10*3/uL (ref 0.7–4.0)
MCH: 33.7 pg (ref 26.0–34.0)
MCHC: 32.8 g/dL (ref 30.0–36.0)
MCV: 102.8 fL — ABNORMAL HIGH (ref 80.0–100.0)
Monocytes Absolute: 0.7 10*3/uL (ref 0.1–1.0)
Monocytes Relative: 9 %
Neutro Abs: 6.8 10*3/uL (ref 1.7–7.7)
Neutrophils Relative %: 80 %
Platelets: 244 10*3/uL (ref 150–400)
RBC: 3.59 MIL/uL — ABNORMAL LOW (ref 3.87–5.11)
RDW: 13.5 % (ref 11.5–15.5)
WBC: 8.5 10*3/uL (ref 4.0–10.5)
nRBC: 0 % (ref 0.0–0.2)

## 2023-10-29 LAB — COMPREHENSIVE METABOLIC PANEL
ALT: 7 U/L (ref 0–44)
AST: 24 U/L (ref 15–41)
Albumin: 3.7 g/dL (ref 3.5–5.0)
Alkaline Phosphatase: 75 U/L (ref 38–126)
Anion gap: 10 (ref 5–15)
BUN: 19 mg/dL (ref 8–23)
CO2: 25 mmol/L (ref 22–32)
Calcium: 9 mg/dL (ref 8.9–10.3)
Chloride: 105 mmol/L (ref 98–111)
Creatinine, Ser: 0.96 mg/dL (ref 0.44–1.00)
GFR, Estimated: >60 mL/min (ref >60)
Glucose, Bld: 115 mg/dL — ABNORMAL HIGH (ref 70–99)
Potassium: 4.2 mmol/L (ref 3.5–5.1)
Sodium: 140 mmol/L (ref 135–145)
Total Bilirubin: 1.4 mg/dL — ABNORMAL HIGH (ref <1.2)
Total Protein: 7.3 g/dL (ref 6.5–8.1)

## 2023-10-29 LAB — RESP PANEL BY RT-PCR (RSV, FLU A&B, COVID)  RVPGX2
Influenza A by PCR: NEGATIVE
Influenza B by PCR: NEGATIVE
Resp Syncytial Virus by PCR: NEGATIVE
SARS Coronavirus 2 by RT PCR: NEGATIVE

## 2023-10-29 LAB — TROPONIN I (HIGH SENSITIVITY): Troponin I (High Sensitivity): 19 ng/L — ABNORMAL HIGH (ref <18)

## 2023-10-29 NOTE — ED Notes (Signed)
Patient discharged at this time. Wheeled to lobby and assisted into vehicle. Breathing unlabored speaking in full sentences. Pt and family Verbalized understanding of all discharge, follow up, and medication teaching. Discharged homed with all belongings.

## 2023-10-29 NOTE — ED Triage Notes (Addendum)
Presents from home via EMS for generalized weakness and increased tremor x3 days. Baseline mobility is with walker but has been unable to ambulate with this device. Today unable to take oral medications. EMS VS: 180/89, 96bpm, 100% RA, 179CBG, 98.59F. Denied pain with EMS.   H/o parkinson  Endorses constipation

## 2023-10-29 NOTE — ED Notes (Signed)
RN and other staff had incredible difficulty obtaining EKG on pt due to pt baseline tremors. EKG with as clear of a picture as possible was obtained and given to MD and MD notified of constraints regarding EKG.

## 2023-10-29 NOTE — ED Provider Notes (Signed)
[facility] Provider Note    Event Date/Time   First MD Initiated Contact with Patient 10/29/23 [phone]     (approximate)   History   Weakness   HPI Monica Watts is a 77 y.o. female with a history of Parkinson's disease and presents with her family for evaluation of worsening overall status.  Over the last few days she has had increased tremor, decreased oral intake and appetite, and more difficulty with basic tasks at home including walking.  Her husband provides the majority of her care but her son is with her at bedside as well.  They have spoken extensively with Dr. Hyacinth Meeker, her PCP, and she has some home health services, but she is getting worse and they wanted to see if there is anything they could be done at this time.  The patient is awake and alert and responding to questions.  She denies having any pain.  She agrees that her symptoms are worsening.  She has not had any falls.  She denies headache and neck pain.  She is not having any chest pain, shortness of breath, nor nausea.  She has no appetite at this time.     Physical Exam   Triage Vital Signs: ED Triage Vitals  Encounter Vitals Group     BP 10/29/23 0309 (!) 158/92     Systolic BP Percentile --      Diastolic BP Percentile --      Pulse Rate 10/29/23 0309 94     Resp 10/29/23 0309 16     Temp 10/29/23 0309 98.5 F (36.9 C)     Temp src --      SpO2 10/29/23 0309 97 %     Weight 10/29/23 0306 63 kg (138 lb 14.2 oz)     Height --      Head Circumference --      Peak Flow --      Pain Score 10/29/23 0306 0     Pain Loc --      Pain Education --      Exclude from Growth Chart --     Most recent vital signs: Vitals:   10/29/23 0309 10/29/23 0544  BP: (!) 158/92 (!) 145/76  Pulse: 94 95  Resp: 16 14  Temp: 98.5 F (36.9 C) 98 F (36.7 C)  SpO2: 97% 100%    General: Awake, alert and appropriately responsive to questions.  Appears chronically ill. CV:  Good peripheral  perfusion.  Regular rate and rhythm. Resp:  Normal effort. no accessory muscle usage nor intercostal retractions.  Normal heart sounds. Abd:  No distention.  No tenderness to palpation. Other:  Resting tremor throughout her body.  Able to move all 4 extremities.  Slow and halting speech with at least a degree of dysarthria.  Swelling her secretions without difficulty.  Appears alert and oriented and able to carry on a conversation with me and seems to understand what I am saying including our discussion about advanced directives.   ED Results / Procedures / Treatments   Labs (all labs ordered are listed, but only abnormal results are displayed) Labs Reviewed  COMPREHENSIVE METABOLIC PANEL - Abnormal; Notable for the following components:      Result Value   Glucose, Bld 115 (*)    Total Bilirubin 1.4 (*)    All other components within normal limits  CBC WITH DIFFERENTIAL/PLATELET - Abnormal; Notable for the following components:   RBC 3.59 (*)  MCV 102.8 (*)    All other components within normal limits  TROPONIN I (HIGH SENSITIVITY) - Abnormal; Notable for the following components:   Troponin I (High Sensitivity) 19 (*)    All other components within normal limits  RESP PANEL BY RT-PCR (RSV, FLU A&B, COVID)  [id number]     EKG  ED ECG REPORT I, Loleta Rose, the attending physician, personally viewed and interpreted this ECG.  Date: 10/29/2023 EKG Time: 3:11 AM Rate: 93 Rhythm: Difficult to interpret but suspect sinus rhythm with heavy artifact due to parkinsonism QRS Axis: normal  Intervals: RBBB ST/T Wave abnormalities: Non-specific ST segment / T-wave changes, but no clear evidence of acute ischemia. Narrative Interpretation: no definitive evidence of acute ischemia; does not meet STEMI criteria.    RADIOLOGY I viewed and interpreted the patient's 1 view chest x-ray.  I see no evidence of pneumonia.  I also read the radiologist's report, which confirmed no acute  findings.   PROCEDURES:  Critical Care performed: No  Procedures    IMPRESSION / MDM / ASSESSMENT AND PLAN / ED COURSE  I reviewed the triage vital signs and the nursing notes.                              Differential diagnosis includes, but is not limited to, worsening or concerns disease, CVA, electrolyte or metabolic abnormality, kidney dysfunction, less likely intracranial bleed  Patient's presentation is most consistent with acute presentation with potential threat to life or bodily function.  Labs/studies ordered: EKG, 1 view chest x-ray, CBC with differential, respiratory viral panel, high-sensitivity troponin, CMP  Interventions/Medications given:  Medications - No data to display  (Note:  hospital course my include additional interventions and/or labs/studies not listed above.)   Stable vital signs, reassuring workup without any clinically significant abnormalities.  I had a long conversation with the patient, her husband, and her stepson about her status.  I explained there is no evidence of an acute or emergent medical condition at this time.  We talked about the possibility of a CT scan of her head but I explained is very unlikely to show anything clinically significant and that an MRI brain would likely be of no utility because of the patient's inability to remain still; her resting tremor would render MRI results virtually unusable.  They understand.  She did not have a moment of acute worsening, but rather she has been worsening over an extended period of time, and it seems to accelerated this week.  They do not wish placement from the ED and I explained that that would not be good for the patient as long as she can be cared for at home.  However I strongly encouraged them to follow-up with the PCP to discuss additional home health options as well as to discuss the possibility of placement.  I also asked her about advanced directive discussions and they were unfamiliar  with the concept and did not know about a DNR/DNI order.  I explained to them what it meant and they will consider it in bring up the discussion about establishing a DNR/DNI with her PCP.  I encouraged them to continue her on her regular medications and for her to eat and drink what she can and what she is willing.  They will follow-up as an outpatient and I gave my usual and customary return precautions.  I considered hospitalization, but there is no evidence of an acute  or emergent condition that could be treated with hospitalization and in fact I think hospitalization will likely worsen her condition and may lead to some delirium and more rapid decline.         FINAL CLINICAL IMPRESSION(S) / ED DIAGNOSES   Final diagnoses:  Parkinson's disease with dyskinesia and fluctuating manifestations (HCC)  Generalized weakness  Failure to thrive in adult     Rx / DC Orders   ED Discharge Orders     None        Note:  This document was prepared using Dragon voice recognition software and may include unintentional dictation errors.   Loleta Rose, MD 10/29/23 [phone]

## 2023-10-29 NOTE — Discharge Instructions (Signed)
As we discussed, although your Parkinson symptoms are worsening, there is no evidence that you have an acute or emergent medical condition tonight.  In short, there is nothing in particular that we can do to help alleviate your symptoms.  It is very important that you follow-up with Dr. Hyacinth Meeker at the next available opportunity to discuss your worsening status and any additional resources that might be available to you (such as additional home health care).  At some point you may require placement in a facility to help with the care you require.  We also very much encourage you to talk with Dr. Hyacinth Meeker about establishing an advance directive.  Please read through the included information and talk with your family about whether you would like to have a Do Not Resuscitate order to keep with you at all times.  Return to the emergency department if you develop new or worsening symptoms that concern you.

## 2024-09-09 DEATH — deceased
